# Patient Record
Sex: Female | Born: 1960 | Hispanic: No | Marital: Married | Smoking: Former smoker
Health system: Southern US, Community
[De-identification: ages and names within clinical notes are randomized; demographics above are authoritative.]

## PROBLEM LIST (undated history)

## (undated) DIAGNOSIS — K219 Gastro-esophageal reflux disease without esophagitis: Secondary | ICD-10-CM

## (undated) DIAGNOSIS — R2230 Localized swelling, mass and lump, unspecified upper limb: Secondary | ICD-10-CM

## (undated) DIAGNOSIS — E049 Nontoxic goiter, unspecified: Secondary | ICD-10-CM

## (undated) DIAGNOSIS — R51 Headache: Secondary | ICD-10-CM

## (undated) DIAGNOSIS — D239 Other benign neoplasm of skin, unspecified: Secondary | ICD-10-CM

## (undated) DIAGNOSIS — G4730 Sleep apnea, unspecified: Secondary | ICD-10-CM

## (undated) DIAGNOSIS — T7840XA Allergy, unspecified, initial encounter: Secondary | ICD-10-CM

## (undated) HISTORY — PX: DILATION AND CURETTAGE OF UTERUS: SHX78

## (undated) HISTORY — DX: Gastro-esophageal reflux disease without esophagitis: K21.9

## (undated) HISTORY — DX: Other benign neoplasm of skin, unspecified: D23.9

## (undated) HISTORY — PX: ABDOMINAL HYSTERECTOMY: SHX81

## (undated) HISTORY — PX: CHOLECYSTECTOMY: SHX55

## (undated) HISTORY — DX: Allergy, unspecified, initial encounter: T78.40XA

## (undated) HISTORY — DX: Nontoxic goiter, unspecified: E04.9

## (undated) HISTORY — DX: Localized swelling, mass and lump, unspecified upper limb: R22.30

---

## 2001-03-17 ENCOUNTER — Encounter: Admission: RE | Admit: 2001-03-17 | Discharge: 2001-03-17 | Payer: Self-pay | Admitting: Family Medicine

## 2002-08-10 ENCOUNTER — Inpatient Hospital Stay (HOSPITAL_COMMUNITY): Admission: EM | Admit: 2002-08-10 | Discharge: 2002-08-12 | Payer: Self-pay | Admitting: Emergency Medicine

## 2012-01-15 ENCOUNTER — Encounter (INDEPENDENT_AMBULATORY_CARE_PROVIDER_SITE_OTHER): Payer: Self-pay

## 2012-01-27 ENCOUNTER — Other Ambulatory Visit: Payer: Self-pay | Admitting: Internal Medicine

## 2012-01-27 DIAGNOSIS — R599 Enlarged lymph nodes, unspecified: Secondary | ICD-10-CM

## 2012-02-03 ENCOUNTER — Ambulatory Visit (HOSPITAL_COMMUNITY)
Admission: RE | Admit: 2012-02-03 | Discharge: 2012-02-03 | Disposition: A | Discharge status: Home / Self Care | Payer: 59 | Source: Ambulatory Visit | Attending: Internal Medicine | Admitting: Internal Medicine

## 2012-02-03 DIAGNOSIS — R599 Enlarged lymph nodes, unspecified: Secondary | ICD-10-CM

## 2012-02-03 DIAGNOSIS — E041 Nontoxic single thyroid nodule: Secondary | ICD-10-CM | POA: Insufficient documentation

## 2012-02-03 DIAGNOSIS — E049 Nontoxic goiter, unspecified: Secondary | ICD-10-CM | POA: Insufficient documentation

## 2012-02-11 ENCOUNTER — Ambulatory Visit (INDEPENDENT_AMBULATORY_CARE_PROVIDER_SITE_OTHER): Payer: 59 | Admitting: General Surgery

## 2012-02-11 ENCOUNTER — Encounter (INDEPENDENT_AMBULATORY_CARE_PROVIDER_SITE_OTHER): Payer: Self-pay | Admitting: General Surgery

## 2012-02-11 VITALS — BP 142/88 | HR 64 | Ht 64.0 in | Wt 195.0 lb

## 2012-02-11 DIAGNOSIS — R2230 Localized swelling, mass and lump, unspecified upper limb: Secondary | ICD-10-CM

## 2012-02-11 DIAGNOSIS — R229 Localized swelling, mass and lump, unspecified: Secondary | ICD-10-CM

## 2012-02-11 NOTE — H&P (Signed)
Carrie Mcclain is an 51 y.o. female.   Chief Complaint: large left axillary mass HPI:The patient reports having had this large left axillary mass for at least 2 years. Recently came to the attention of her primary care physician Dr. Jasmine December and cousins. Evaluation included bilateral mammograms along with an ultrasound of the mass along with a core biopsy. They could not rule out a degenerating malignancy however it looks more inflammatory than malignant.  She was referred for evaluation and excision.  Past Medical History  Diagnosis Date  . Axillary mass     Past Surgical History  Procedure Date  . Cholecystectomy   . Abdominal hysterectomy   . Dilation and curettage of uterus     Family History  Problem Relation Age of Onset  . Diabetes    . Heart disease Mother    Social History:  reports that she has quit smoking. She does not have any smokeless tobacco history on file. She reports that she drinks alcohol. She reports that she does not use illicit drugs.  Allergies:  Allergies  Allergen Reactions  . Penicillins Hives and Other (See Comments)    Difficulty swallowing   . Sulfa Antibiotics     No current outpatient prescriptions on file as of 02/11/2012.   No current facility-administered medications on file as of 02/11/2012.    No results found for this or any previous visit (from the past 48 hour(s)). No results found.  Review of Systems  Constitutional: Negative.   HENT: Negative.   Eyes: Negative.   Cardiovascular: Negative.   Gastrointestinal: Negative.   Genitourinary: Negative.   Musculoskeletal: Negative.   Skin: Negative.   Neurological: Negative.   Endo/Heme/Allergies: Negative.   Psychiatric/Behavioral: Negative.     Blood pressure 142/88, pulse 64, height 5\' 4"  (1.626 m), weight 88.451 kg (195 lb). Physical Exam  Constitutional: She is oriented to person, place, and time. She appears well-developed and well-nourished.  HENT:  Head: Normocephalic  and atraumatic.  Eyes: Conjunctivae and EOM are normal. Pupils are equal, round, and reactive to light.  Neck: Trachea normal and phonation normal. Normal carotid pulses present. Carotid bruit is not present. Mass (left thyroid goiter) and thyromegaly (left goiter) present.    Cardiovascular: Normal rate, regular rhythm and normal heart sounds.   Respiratory: Effort normal and breath sounds normal. Chest wall is not dull to percussion. She exhibits mass (see diagram of left axilla). She exhibits no tenderness, no bony tenderness, no laceration and no crepitus. Right breast exhibits no inverted nipple, no mass, no nipple discharge and no skin change. Left breast exhibits no inverted nipple, no mass, no nipple discharge and no skin change. Breasts are symmetrical.    GI: Soft. Bowel sounds are normal.  Musculoskeletal: Normal range of motion.  Neurological: She is alert and oriented to person, place, and time. She has normal reflexes.  Skin: Skin is warm and dry.  Psychiatric: She has a normal mood and affect. Her behavior is normal. Judgment and thought content normal.     Assessment/Plan Large left cystic axillary mass.  Left thyroid goiter. Currently under evaluation by her primary care physician Dr. Margaretmary Bayley  We are planning an excision under general anesthesia the left axillary mass.  Hooper Petteway III,Ruqayyah Lute O 02/11/2012, 9:03 AM

## 2012-02-11 NOTE — Progress Notes (Signed)
Encounter note in H&P  JW

## 2012-02-12 ENCOUNTER — Encounter (HOSPITAL_COMMUNITY): Payer: Self-pay | Admitting: Respiratory Therapy

## 2012-02-12 ENCOUNTER — Encounter (INDEPENDENT_AMBULATORY_CARE_PROVIDER_SITE_OTHER): Payer: Self-pay

## 2012-02-17 ENCOUNTER — Encounter (HOSPITAL_COMMUNITY): Payer: Self-pay

## 2012-02-17 ENCOUNTER — Encounter (HOSPITAL_COMMUNITY)
Admission: RE | Admit: 2012-02-17 | Discharge: 2012-02-17 | Disposition: A | Discharge status: Home / Self Care | Payer: 59 | Source: Ambulatory Visit | Attending: General Surgery | Admitting: General Surgery

## 2012-02-17 ENCOUNTER — Other Ambulatory Visit: Payer: Self-pay

## 2012-02-17 HISTORY — DX: Sleep apnea, unspecified: G47.30

## 2012-02-17 HISTORY — DX: Headache: R51

## 2012-02-17 LAB — CBC
HCT: 40.2 % (ref 36.0–46.0)
MCV: 77.9 fL — ABNORMAL LOW (ref 78.0–100.0)
Platelets: 337 10*3/uL (ref 150–400)
RBC: 5.16 MIL/uL — ABNORMAL HIGH (ref 3.87–5.11)
WBC: 6.5 10*3/uL (ref 4.0–10.5)

## 2012-02-17 LAB — BASIC METABOLIC PANEL
CO2: 28 mEq/L (ref 19–32)
Calcium: 9.6 mg/dL (ref 8.4–10.5)
Chloride: 105 mEq/L (ref 96–112)
Glucose, Bld: 104 mg/dL — ABNORMAL HIGH (ref 70–99)
Sodium: 141 mEq/L (ref 135–145)

## 2012-02-17 LAB — SURGICAL PCR SCREEN: Staphylococcus aureus: NEGATIVE

## 2012-02-17 LAB — DIFFERENTIAL
Eosinophils Relative: 4 % (ref 0–5)
Lymphocytes Relative: 44 % (ref 12–46)
Lymphs Abs: 2.9 10*3/uL (ref 0.7–4.0)

## 2012-02-17 NOTE — Pre-Procedure Instructions (Addendum)
20 Carrie Mcclain  02/17/2012   Your procedure is scheduled on: 3.6.13  Report to [id number] AM.  Call this number if you have problems the morning of surgery: [phone]   Remember:   Do not eat food:After Midnight.  May have clear liquids: up to 4 Hours before arrival.430 am  Clear liquids include soda, tea, black coffee, apple or grape juice, broth.  Take these medicines the morning of surgery with A SIP OF WATER: none   Do not wear jewelry, make-up or nail polish.  Do not wear lotions, powders, or perfumes. You may wear deodorant.  Do not shave 48 hours prior to surgery.  Do not bring valuables to the hospital.  Contacts, dentures or bridgework may not be worn into surgery.  Leave suitcase in the car. After surgery it may be brought to your room.  For patients admitted to the hospital, checkout time is 11:00 AM the day of discharge.   Patients discharged the day of surgery will not be allowed to drive home.  Name and phone number of your driver: Carrie Mcclain [phone]  Special Instructions: CHG Shower Use Special Wash: 1/2 bottle night before surgery and 1/2 bottle morning of surgery.   Please read over the following fact sheets that you were given: Pain Booklet, Coughing and Deep Breathing, MRSA Information and Surgical Site Infection Prevention

## 2012-02-17 NOTE — Progress Notes (Signed)
Anesthesia to review cxr  ----tracheal shift---- notes in epic re  Thyroid  Scan enlarged

## 2012-02-18 ENCOUNTER — Encounter (HOSPITAL_COMMUNITY): Payer: Self-pay | Admitting: Vascular Surgery

## 2012-02-18 MED ORDER — CIPROFLOXACIN IN D5W 400 MG/200ML IV SOLN
400.0000 mg | Freq: Two times a day (BID) | INTRAVENOUS | Status: DC
  Administered 2012-02-19: 400 mg via INTRAVENOUS
  Filled 2012-02-18 (×2): qty 200

## 2012-02-18 NOTE — Consult Note (Signed)
Anesthesia:  Patient is a 51 year old female scheduled for excision of a left axillary mass (core biopsy was non-diagnostic but favored inflammatory over malignant process) on 02/19/12 with planned GA.  History includes OSA, headaches, former smoker, left thyroid goiter being followed by Dr. Margaretmary Bayley.  Her BP since February have been moderately elevated, although I do not see that she has a formal diagnosis of HTN.  Labs noted.  EKG shows NSR.    CXR from 02/17/12 showed mild enlargement of the cardiac silhouette. No pulmonary edema, pneumonia other acute cardiopulmonary abnormality is evident. There is deviation of the trachea to the right.   Ultrasound of the neck on 02/03/12 showed: 1. Left-sided thyroid goiter.  2. Small right-sided thyroid cysts. 3. Right thyroid lobe: Measures 4.1 x 1.4 x 1.5 cm  4. Left thyroid lobe: Measures 8.1 x 4.9 x 6.2 cm  5. Isthmus: Measures 0.7 cm   She did not report a history of difficult intubation, but with a thyroid goiter there is the potential risk.  She will be evaluated by her assigned Anesthesiologist on the day of surgery and a definitive anesthesia plan with airway management will be discussed with her at that time.  She has two different weights in Epic within the last month that are conflicting by over 90 lbs, so I will order that she be re-weighed when she arrives to Short Stay on the day of surgery to ensure accuracy.

## 2012-02-18 NOTE — Anesthesia Preprocedure Evaluation (Addendum)
Anesthesia Evaluation  Patient identified by MRN, date of birth, ID band Patient awake  General Assessment Comment:Potential for difficult airway d/t left goiter with right tracheal deviation.  Reviewed: Allergy & Precautions, H&P , NPO status , Patient's Chart, lab work & pertinent test results, reviewed documented beta blocker date and time   History of Anesthesia Complications (+) DIFFICULT AIRWAY  Airway Mallampati: II TM Distance: >3 FB Neck ROM: Full    Dental  (+) Dental Advisory Given and Teeth Intact   Pulmonary sleep apnea ,    Pulmonary exam normal       Cardiovascular negative cardio ROS      Neuro/Psych  Headaches,    GI/Hepatic negative GI ROS, Neg liver ROS,   Endo/Other  Morbid obesityLeft sided goiter  Renal/GU negative Renal ROS     Musculoskeletal   Abdominal   Peds  Hematology   Anesthesia Other Findings   Reproductive/Obstetrics                          Anesthesia Physical Anesthesia Plan  ASA: III  Anesthesia Plan: General   Post-op Pain Management:    Induction:   Airway Management Planned: Oral ETT and LMA  Additional Equipment:   Intra-op Plan:   Post-operative Plan: Extubation in OR  Informed Consent: I have reviewed the patients History and Physical, chart, labs and discussed the procedure including the risks, benefits and alternatives for the proposed anesthesia with the patient or authorized representative who has indicated his/her understanding and acceptance.   Dental advisory given  Plan Discussed with: CRNA, Anesthesiologist and Surgeon  Anesthesia Plan Comments:         Anesthesia Quick Evaluation

## 2012-02-19 ENCOUNTER — Ambulatory Visit (HOSPITAL_COMMUNITY)
Admission: RE | Admit: 2012-02-19 | Discharge: 2012-02-19 | Disposition: A | Discharge status: Home / Self Care | Payer: 59 | Source: Ambulatory Visit | Attending: General Surgery | Admitting: General Surgery

## 2012-02-19 ENCOUNTER — Ambulatory Visit (HOSPITAL_BASED_OUTPATIENT_CLINIC_OR_DEPARTMENT_OTHER): Admit: 2012-02-19 | Payer: Self-pay | Admitting: General Surgery

## 2012-02-19 ENCOUNTER — Encounter (HOSPITAL_COMMUNITY)
Admission: RE | Disposition: A | Discharge status: Home / Self Care | Payer: Self-pay | Source: Ambulatory Visit | Attending: General Surgery

## 2012-02-19 ENCOUNTER — Encounter (HOSPITAL_COMMUNITY): Payer: Self-pay | Admitting: *Deleted

## 2012-02-19 ENCOUNTER — Encounter (HOSPITAL_COMMUNITY): Payer: Self-pay | Admitting: Vascular Surgery

## 2012-02-19 ENCOUNTER — Encounter (HOSPITAL_BASED_OUTPATIENT_CLINIC_OR_DEPARTMENT_OTHER): Payer: Self-pay

## 2012-02-19 ENCOUNTER — Ambulatory Visit (HOSPITAL_COMMUNITY): Payer: 59 | Admitting: Vascular Surgery

## 2012-02-19 DIAGNOSIS — R2230 Localized swelling, mass and lump, unspecified upper limb: Secondary | ICD-10-CM

## 2012-02-19 DIAGNOSIS — Z01812 Encounter for preprocedural laboratory examination: Secondary | ICD-10-CM | POA: Insufficient documentation

## 2012-02-19 DIAGNOSIS — D235 Other benign neoplasm of skin of trunk: Secondary | ICD-10-CM | POA: Insufficient documentation

## 2012-02-19 DIAGNOSIS — E049 Nontoxic goiter, unspecified: Secondary | ICD-10-CM | POA: Insufficient documentation

## 2012-02-19 HISTORY — PX: MASS EXCISION: SHX2000

## 2012-02-19 SURGERY — EXCISION, HIDRADENITIS, AXILLA
Anesthesia: General | Laterality: Left

## 2012-02-19 SURGERY — EXCISION MASS
Anesthesia: General | Site: Axilla | Laterality: Left | Wound class: Clean

## 2012-02-19 SURGERY — EXCISION, HIDRADENITIS, AXILLA
Anesthesia: General | Site: Axilla | Laterality: Left

## 2012-02-19 MED ORDER — DROPERIDOL 2.5 MG/ML IJ SOLN: 0.6250 mg | INTRAMUSCULAR | Status: DC | PRN

## 2012-02-19 MED ORDER — ONDANSETRON HCL 4 MG/2ML IJ SOLN
INTRAMUSCULAR | Status: DC | PRN
  Administered 2012-02-19: 4 mg via INTRAVENOUS

## 2012-02-19 MED ORDER — BUPIVACAINE-EPINEPHRINE 0.5% -1:200000 IJ SOLN: INTRAMUSCULAR | Status: DC | PRN

## 2012-02-19 MED ORDER — LACTATED RINGERS IV SOLN
INTRAVENOUS | Status: DC | PRN
  Administered 2012-02-19 (×2): via INTRAVENOUS

## 2012-02-19 MED ORDER — LACTATED RINGERS IV SOLN
INTRAVENOUS | Status: DC
  Administered 2012-02-19: 10:00:00 via INTRAVENOUS

## 2012-02-19 MED ORDER — SUCCINYLCHOLINE CHLORIDE 20 MG/ML IJ SOLN
INTRAMUSCULAR | Status: DC | PRN
  Administered 2012-02-19: 120 mg via INTRAVENOUS

## 2012-02-19 MED ORDER — FENTANYL CITRATE 0.05 MG/ML IJ SOLN
INTRAMUSCULAR | Status: DC | PRN
  Administered 2012-02-19: 50 ug via INTRAVENOUS
  Administered 2012-02-19: 100 ug via INTRAVENOUS
  Administered 2012-02-19: 50 ug via INTRAVENOUS

## 2012-02-19 MED ORDER — PROPOFOL 10 MG/ML IV EMUL
INTRAVENOUS | Status: DC | PRN
  Administered 2012-02-19: 200 mg via INTRAVENOUS

## 2012-02-19 MED ORDER — 0.9 % SODIUM CHLORIDE (POUR BTL) OPTIME
TOPICAL | Status: DC | PRN
  Administered 2012-02-19: 1000 mL

## 2012-02-19 MED ORDER — HYDROMORPHONE HCL PF 1 MG/ML IJ SOLN
0.2500 mg | INTRAMUSCULAR | Status: DC | PRN
  Administered 2012-02-19 (×2): 0.25 mg via INTRAVENOUS

## 2012-02-19 MED ORDER — HYDROCODONE-ACETAMINOPHEN 5-500 MG PO TABS: 1.0000 | ORAL_TABLET | Freq: Four times a day (QID) | ORAL | Status: AC | PRN

## 2012-02-19 MED ORDER — MIDAZOLAM HCL 2 MG/2ML IJ SOLN: 1.0000 mg | INTRAMUSCULAR | Status: DC | PRN

## 2012-02-19 MED ORDER — MIDAZOLAM HCL 5 MG/5ML IJ SOLN
INTRAMUSCULAR | Status: DC | PRN
  Administered 2012-02-19: 2 mg via INTRAVENOUS

## 2012-02-19 MED ORDER — EPHEDRINE SULFATE 50 MG/ML IJ SOLN
INTRAMUSCULAR | Status: DC | PRN
  Administered 2012-02-19 (×2): 5 mg via INTRAVENOUS
  Administered 2012-02-19: 10 mg via INTRAVENOUS

## 2012-02-19 SURGICAL SUPPLY — 52 items
APPLIER CLIP 11 MED OPEN (CLIP) ×2
BLADE SURG 10 STRL SS (BLADE) ×2 IMPLANT
BLADE SURG 15 STRL LF DISP TIS (BLADE) ×1 IMPLANT
BLADE SURG 15 STRL SS (BLADE) ×1
CANISTER SUCTION 2500CC (MISCELLANEOUS) ×2 IMPLANT
CHLORAPREP W/TINT 26ML (MISCELLANEOUS) ×2 IMPLANT
CLEANER TIP ELECTROSURG 2X2 (MISCELLANEOUS) ×2 IMPLANT
CLIP APPLIE 11 MED OPEN (CLIP) ×1 IMPLANT
CLOTH BEACON ORANGE TIMEOUT ST (SAFETY) ×2 IMPLANT
COVER SURGICAL LIGHT HANDLE (MISCELLANEOUS) ×2 IMPLANT
DECANTER SPIKE VIAL GLASS SM (MISCELLANEOUS) ×2 IMPLANT
DERMABOND ADVANCED (GAUZE/BANDAGES/DRESSINGS) ×1
DERMABOND ADVANCED .7 DNX12 (GAUZE/BANDAGES/DRESSINGS) ×1 IMPLANT
DRAPE LAPAROTOMY T 102X78X121 (DRAPES) ×2 IMPLANT
DRAPE PROXIMA HALF (DRAPES) ×2 IMPLANT
DRAPE UTILITY 15X26 W/TAPE STR (DRAPE) ×4 IMPLANT
ELECT REM PT RETURN 9FT ADLT (ELECTROSURGICAL) ×2
ELECTRODE REM PT RTRN 9FT ADLT (ELECTROSURGICAL) ×1 IMPLANT
GAUZE SPONGE 4X4 16PLY XRAY LF (GAUZE/BANDAGES/DRESSINGS) ×2 IMPLANT
GLOVE BIO SURGEON STRL SZ7.5 (GLOVE) ×2 IMPLANT
GLOVE BIOGEL PI IND STRL 7.0 (GLOVE) ×2 IMPLANT
GLOVE BIOGEL PI IND STRL 7.5 (GLOVE) ×1 IMPLANT
GLOVE BIOGEL PI IND STRL 8 (GLOVE) ×1 IMPLANT
GLOVE BIOGEL PI INDICATOR 7.0 (GLOVE) ×2
GLOVE BIOGEL PI INDICATOR 7.5 (GLOVE) ×1
GLOVE BIOGEL PI INDICATOR 8 (GLOVE) ×1
GLOVE ECLIPSE 7.5 STRL STRAW (GLOVE) ×2 IMPLANT
GLOVE SURG SS PI 6.5 STRL IVOR (GLOVE) ×4 IMPLANT
GOWN STRL NON-REIN LRG LVL3 (GOWN DISPOSABLE) ×8 IMPLANT
KIT BASIN OR (CUSTOM PROCEDURE TRAY) ×2 IMPLANT
KIT ROOM TURNOVER OR (KITS) ×2 IMPLANT
NEEDLE HYPO 25X1 1.5 SAFETY (NEEDLE) ×2 IMPLANT
NS IRRIG 1000ML POUR BTL (IV SOLUTION) ×2 IMPLANT
PACK SURGICAL SETUP 50X90 (CUSTOM PROCEDURE TRAY) ×2 IMPLANT
PAD ARMBOARD 7.5X6 YLW CONV (MISCELLANEOUS) ×2 IMPLANT
PENCIL BUTTON HOLSTER BLD 10FT (ELECTRODE) ×2 IMPLANT
SPECIMEN JAR MEDIUM (MISCELLANEOUS) ×2 IMPLANT
SPECIMEN JAR SMALL (MISCELLANEOUS) IMPLANT
SPONGE GAUZE 4X4 12PLY (GAUZE/BANDAGES/DRESSINGS) ×2 IMPLANT
SPONGE LAP 18X18 X RAY DECT (DISPOSABLE) ×2 IMPLANT
STAPLER VISISTAT 35W (STAPLE) ×2 IMPLANT
STRIP CLOSURE SKIN 1/2X4 (GAUZE/BANDAGES/DRESSINGS) IMPLANT
SUT MNCRL AB 4-0 PS2 18 (SUTURE) ×2 IMPLANT
SUT VIC AB 3-0 SH 18 (SUTURE) ×2 IMPLANT
SUT VIC AB 3-0 SH 27 (SUTURE) ×1
SUT VIC AB 3-0 SH 27X BRD (SUTURE) ×1 IMPLANT
SYR BULB 3OZ (MISCELLANEOUS) ×2 IMPLANT
SYR CONTROL 10ML LL (SYRINGE) ×2 IMPLANT
TOWEL OR 17X24 6PK STRL BLUE (TOWEL DISPOSABLE) ×2 IMPLANT
TOWEL OR 17X26 10 PK STRL BLUE (TOWEL DISPOSABLE) ×2 IMPLANT
TUBE CONNECTING 12X1/4 (SUCTIONS) ×2 IMPLANT
YANKAUER SUCT BULB TIP NO VENT (SUCTIONS) ×2 IMPLANT

## 2012-02-19 NOTE — Preoperative (Signed)
Beta Blockers   Reason not to administer Beta Blockers:Not Applicable 

## 2012-02-19 NOTE — Interval H&P Note (Signed)
History and Physical Interval Note:  02/19/2012 8:33 AM  Carrie Mcclain  has presented today for surgery, with the diagnosis of large cystic mass left axilla  The various methods of treatment have been discussed with the patient and family. After consideration of risks, benefits and other options for treatment, the patient has consented to  Procedure(s) (LRB): EXCISION AXILLARY MASS (Left) as a surgical intervention .  The patients' history has been reviewed, patient examined, no change in status, stable for surgery.  I have reviewed the patients' chart and labs.  Questions were answered to the patient's satisfaction.     Kdyn Vonbehren III,Henretta Quist O

## 2012-02-19 NOTE — Op Note (Signed)
OPERATIVE REPORT  DATE OF OPERATION: 02/19/2012  PATIENT:  Benjie Karvonen  51 y.o. female  PRE-OPERATIVE DIAGNOSIS:  Large left axillary cystic mass.  POST-OPERATIVE DIAGNOSIS:  Large left axillary cystic mass, hemorrhagic  PROCEDURE:  Procedure(s): EXCISION LEFT AXILLARY MASS  SURGEON:  Surgeon(s): Cherylynn Ridges III, MD  ASSISTANT: None  ANESTHESIA:   general  EBL: 30 ml  BLOOD ADMINISTERED: none  DRAINS: none   SPECIMEN:  Source of Specimen:  Left axillary cyst.  COUNTS CORRECT:  YES  PROCEDURE DETAILS: The patient was taken to the operating room and placed on the table in the supine position. After an adequate endotracheal anesthetic was administered her left arm was placed out at right angles to her body that she was prepped and draped in the usual sterile manner exposing her left axilla.  After proper time out was performed identifying the patient and the procedure to be performed we made a 5-6 cm transverse axillary anterior to posterior incision using a #15 blade taken name oval piece of skin along with this very large 4-5 cm mass. We subsequently dissected out this hemorrhagic cystic mass using electrocautery Metzenbaum scissors and a #15 blade. Care was taken not to rupture this cyst prior to its removal however in the end stages of removal he did rupture and clean out some clear hemorrhagic fluid. The no pus.  Weak of excessive redundant skin then subsequently closed after obtaining adequate hemostasis using electrocautery. 3-0 Vicryl pop also used to reapproximate the subcutaneous tissue then the skin was closed using stainless steel staples. All counts were correct. Betadine ointment and 4 x 4 gauze used to complete the dressings  PATIENT DISPOSITION:  PACU - hemodynamically stable.   Grey Schlauch III,Miakoda Mcmillion O 3/6/201311:57 AM

## 2012-02-19 NOTE — Transfer of Care (Signed)
Immediate Anesthesia Transfer of Care Note  Patient: Carrie Mcclain  Procedure(s) Performed: Procedure(s) (LRB): EXCISION MASS (Left)  Patient Location: PACU  Anesthesia Type: General  Level of Consciousness: awake and patient cooperative  Airway & Oxygen Therapy: Patient Spontanous Breathing and Patient connected to face mask oxygen Stable  Post vital signs: Reviewed and stable  Complications: No apparent anesthesia complications

## 2012-02-19 NOTE — H&P (View-Only) (Signed)
Carrie Mcclain is an 50 y.Mcclain. female.   Chief Complaint: large left axillary mass HPI:The patient reports having had this large left axillary mass for at least 2 years. Recently came to the attention of her primary care physician Carrie Mcclain and cousins. Evaluation included bilateral mammograms along with an ultrasound of the mass along with a core biopsy. They could not rule out a degenerating malignancy however it looks more inflammatory than malignant.  She was referred for evaluation and excision.  Past Medical History  Diagnosis Date  . Axillary mass     Past Surgical History  Procedure Date  . Cholecystectomy   . Abdominal hysterectomy   . Dilation and curettage of uterus     Family History  Problem Relation Age of Onset  . Diabetes    . Heart disease Mother    Social History:  reports that she has quit smoking. She does not have any smokeless tobacco history on file. She reports that she drinks alcohol. She reports that she does not use illicit drugs.  Allergies:  Allergies  Allergen Reactions  . Penicillins Hives and Other (See Comments)    Difficulty swallowing   . Sulfa Antibiotics     No current outpatient prescriptions on file as of 02/11/2012.   No current facility-administered medications on file as of 02/11/2012.    No results found for this or any previous visit (from the past 48 hour(s)). No results found.  Review of Systems  Constitutional: Negative.   HENT: Negative.   Eyes: Negative.   Cardiovascular: Negative.   Gastrointestinal: Negative.   Genitourinary: Negative.   Musculoskeletal: Negative.   Skin: Negative.   Neurological: Negative.   Endo/Heme/Allergies: Negative.   Psychiatric/Behavioral: Negative.     Blood pressure 142/88, pulse 64, height 5' 4" (1.626 m), weight 88.451 kg (195 lb). Physical Exam  Constitutional: She is oriented to person, place, and time. She appears well-developed and well-nourished.  HENT:  Head: Normocephalic  and atraumatic.  Eyes: Conjunctivae and EOM are normal. Pupils are equal, round, and reactive to light.  Neck: Trachea normal and phonation normal. Normal carotid pulses present. Carotid bruit is not present. Mass (left thyroid goiter) and thyromegaly (left goiter) present.    Cardiovascular: Normal rate, regular rhythm and normal heart sounds.   Respiratory: Effort normal and breath sounds normal. Chest wall is not dull to percussion. She exhibits mass (see diagram of left axilla). She exhibits no tenderness, no bony tenderness, no laceration and no crepitus. Right breast exhibits no inverted nipple, no mass, no nipple discharge and no skin change. Left breast exhibits no inverted nipple, no mass, no nipple discharge and no skin change. Breasts are symmetrical.    GI: Soft. Bowel sounds are normal.  Musculoskeletal: Normal range of motion.  Neurological: She is alert and oriented to person, place, and time. She has normal reflexes.  Skin: Skin is warm and dry.  Psychiatric: She has a normal mood and affect. Her behavior is normal. Judgment and thought content normal.     Assessment/Plan Large left cystic axillary mass.  Left thyroid goiter. Currently under evaluation by her primary care physician Carrie Mcclain  We are planning an excision under general anesthesia the left axillary mass.  Carrie Mcclain,Carrie Mcclain 02/11/2012, 9:03 AM    

## 2012-02-19 NOTE — Anesthesia Postprocedure Evaluation (Signed)
Anesthesia Post Note  Patient: Carrie Mcclain  Procedure(s) Performed: Procedure(s) (LRB): EXCISION MASS (Left)  Anesthesia type: general  Patient location: PACU  Post pain: Pain level controlled  Post assessment: Patient's Cardiovascular Status Stable  Last Vitals:  Filed Vitals:   02/19/12 1230  BP:   Pulse: 102  Temp:   Resp: 20    Post vital signs: Reviewed and stable  Level of consciousness: sedated  Complications: No apparent anesthesia complications

## 2012-02-19 NOTE — Anesthesia Procedure Notes (Signed)
Procedure Name: Intubation Date/Time: 02/19/2012 10:37 AM Performed by: Malachi Pro Pre-anesthesia Checklist: Patient identified, Emergency Drugs available, Suction available, Patient being monitored and Timeout performed Patient Re-evaluated:Patient Re-evaluated prior to inductionOxygen Delivery Method: Circle system utilized Preoxygenation: Pre-oxygenation with 100% oxygen Intubation Type: Rapid sequence and IV induction Ventilation: Mask ventilation without difficulty Laryngoscope Size: Mac and 3 Grade View: Grade II Tube type: Oral Number of attempts: 1 Airway Equipment and Method: Stylet Placement Confirmation: ETT inserted through vocal cords under direct vision,  positive ETCO2 and breath sounds checked- equal and bilateral Secured at: 23 cm Tube secured with: Tape Dental Injury: Teeth and Oropharynx as per pre-operative assessment

## 2012-02-21 ENCOUNTER — Encounter (INDEPENDENT_AMBULATORY_CARE_PROVIDER_SITE_OTHER): Payer: Self-pay

## 2012-02-21 ENCOUNTER — Encounter (HOSPITAL_COMMUNITY): Payer: Self-pay | Admitting: General Surgery

## 2012-02-24 ENCOUNTER — Encounter (INDEPENDENT_AMBULATORY_CARE_PROVIDER_SITE_OTHER): Payer: Self-pay

## 2012-03-04 ENCOUNTER — Ambulatory Visit (INDEPENDENT_AMBULATORY_CARE_PROVIDER_SITE_OTHER): Payer: 59 | Admitting: General Surgery

## 2012-03-04 DIAGNOSIS — R229 Localized swelling, mass and lump, unspecified: Secondary | ICD-10-CM

## 2012-03-04 DIAGNOSIS — R2230 Localized swelling, mass and lump, unspecified upper limb: Secondary | ICD-10-CM

## 2012-03-04 NOTE — Progress Notes (Signed)
Patient returning to the office for staple removal from left axillary excision (hidradenititis) staples removed, incision intact and patient tolerated well, I applied steri strips and placed dry gauze over the area to absorb moisture, patient will follow up with Dr. Lindie Spruce in one week (03/12/12)

## 2012-03-12 ENCOUNTER — Ambulatory Visit (INDEPENDENT_AMBULATORY_CARE_PROVIDER_SITE_OTHER): Payer: 59 | Admitting: General Surgery

## 2012-03-12 ENCOUNTER — Encounter (INDEPENDENT_AMBULATORY_CARE_PROVIDER_SITE_OTHER): Payer: Self-pay | Admitting: General Surgery

## 2012-03-12 VITALS — BP 128/84 | HR 68 | Ht 64.0 in | Wt 299.0 lb

## 2012-03-12 DIAGNOSIS — D239 Other benign neoplasm of skin, unspecified: Secondary | ICD-10-CM

## 2012-03-12 DIAGNOSIS — Z09 Encounter for follow-up examination after completed treatment for conditions other than malignant neoplasm: Secondary | ICD-10-CM

## 2012-03-12 HISTORY — DX: Other benign neoplasm of skin, unspecified: D23.9

## 2012-03-12 NOTE — Progress Notes (Signed)
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 She was seen in this office previously for staple removal which went well.  PE On examination today her wound is completely healed with no evidence of infection. There is some thickening of the scar in the central portion but not to the point of calling it keloid formation.  Studiy review None.  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  Plan Reassess the patient on a p.r.n. basis.

## 2012-09-15 ENCOUNTER — Ambulatory Visit (HOSPITAL_COMMUNITY)
Admission: RE | Admit: 2012-09-15 | Discharge: 2012-09-15 | Disposition: A | Discharge status: Home / Self Care | Payer: 59 | Source: Ambulatory Visit | Attending: Ophthalmology | Admitting: Ophthalmology

## 2012-09-15 DIAGNOSIS — H5310 Unspecified subjective visual disturbances: Secondary | ICD-10-CM

## 2012-09-15 DIAGNOSIS — H3400 Transient retinal artery occlusion, unspecified eye: Secondary | ICD-10-CM | POA: Insufficient documentation

## 2012-09-15 DIAGNOSIS — G453 Amaurosis fugax: Secondary | ICD-10-CM

## 2012-09-15 NOTE — Progress Notes (Signed)
VASCULAR LAB PRELIMINARY  PRELIMINARY  PRELIMINARY  PRELIMINARY  Carotid duplex has been  completed.    Preliminary report:  No significant ICA stenosis or plaque noted bilaterally.  Carrie Mcclain, 09/15/2012, 10:33 AM

## 2013-07-05 ENCOUNTER — Ambulatory Visit (INDEPENDENT_AMBULATORY_CARE_PROVIDER_SITE_OTHER): Payer: BC Managed Care – PPO | Admitting: Physician Assistant

## 2013-07-05 VITALS — BP 130/82 | HR 84 | Temp 98.1°F | Resp 18 | Ht 64.0 in | Wt 286.0 lb

## 2013-07-05 DIAGNOSIS — J309 Allergic rhinitis, unspecified: Secondary | ICD-10-CM

## 2013-07-05 DIAGNOSIS — R062 Wheezing: Secondary | ICD-10-CM

## 2013-07-05 MED ORDER — ALBUTEROL SULFATE HFA 108 (90 BASE) MCG/ACT IN AERS: 2.0000 | INHALATION_SPRAY | RESPIRATORY_TRACT | Status: DC | PRN

## 2013-07-05 MED ORDER — FLUTICASONE PROPIONATE 50 MCG/ACT NA SUSP: 2.0000 | Freq: Every day | NASAL | Status: DC

## 2013-07-05 NOTE — Progress Notes (Signed)
  Subjective:    Patient ID: Carrie Mcclain, female    DOB: July 06, 1961, 52 y.o.   MRN: [id number]  HPI    Carrie Mcclain is a very pleasant 52 yr old female here with concern for allergic reaction.  States she has been having episodes where she feels itchy, has an itchy throat, coughing and wheezing.  These episodes have happened in the past but seem to be happening more frequently - twice today, several nights this week.  Wheezing seems to be worse at night.  These episodes are lasting about an hour.  Zyrtec has helped symptoms - this is the only thing she has tried so far.  No history of asthma - but reports bronchitis from time to time, though this feels different.  She denies allergic rhinitis.  She denies any new contacts, specifically denies new foods, pets, detergents, soaps, lotions.  Denies any plant exposure but states she was working outside the other day.  Describes runny nose and sneezing, itchy ears, itchy throat.  Cough is productive of clear mucus.  No fever or chills.  No CP.  No NV.  Has used some sort of inhaler in the past - thinks maybe "the purple one".  Thinks may have used a nasal spray a long time ago.     Review of Systems  Constitutional: Negative for fever and chills.  HENT: Positive for rhinorrhea and sneezing. Negative for ear pain.   Respiratory: Positive for cough and wheezing.   Cardiovascular: Negative for chest pain.  Gastrointestinal: Negative.   Musculoskeletal: Negative.   Skin: Negative.   Neurological: Negative.        Objective:   Physical Exam  Vitals reviewed. Constitutional: She is oriented to person, place, and time. She appears well-developed and well-nourished.  HENT:  Head: Normocephalic and atraumatic.  Right Ear: Tympanic membrane and ear canal normal.  Nose: Mucosal edema present.  Mouth/Throat: Uvula is midline, oropharynx is clear and moist and mucous membranes are normal.  Left EAC occluded with cerumen  Eyes: Conjunctivae are normal. No  scleral icterus.  Neck: Neck supple.  Cardiovascular: Normal rate, regular rhythm and normal heart sounds.   Pulmonary/Chest: Effort normal. She has no decreased breath sounds. She has wheezes (with forced expiration). She has no rhonchi. She has no rales.  Abdominal: Soft. There is no tenderness.  Lymphadenopathy:    She has no cervical adenopathy.  Neurological: She is alert and oriented to person, place, and time.  Skin: Skin is warm and dry.  Psychiatric: She has a normal mood and affect. Her behavior is normal.        Assessment & Plan:  Allergic rhinitis - Plan: albuterol (PROVENTIL HFA;VENTOLIN HFA) 108 (90 BASE) MCG/ACT inhaler, fluticasone (FLONASE) 50 MCG/ACT nasal spray  Wheezing - Plan: albuterol (PROVENTIL HFA;VENTOLIN HFA) 108 (90 BASE) MCG/ACT inhaler   Carrie Mcclain is a very pleasant 52 yr old female with episodes of wheezing, coughing, rhinorrhea - I think her symptoms are likely allergic, though it is difficult to say what the trigger is.  On exam, she is afebrile and in no distress, pulse ox 98% on RA, lungs with fine wheezes on forced expiration only.  Will continue daily Zyrtec.  Add Flonase.  Add albuterol q4h prn wheezing.  Pt to call or RTC if worsening or not improving.

## 2013-07-05 NOTE — Patient Instructions (Addendum)
Continue taking the Zyrtec once daily.  Begin using the Flonase (fluticasone) daily as well.  Both of these medicine work best with consistent daily use.  Also begin using the albuterol inhaler every 4 hours if needed for wheezing/coughing.  Please let me know if any symptoms are worsening or not improving, or if new symptoms develop.   Allergic Rhinitis Allergic rhinitis is when the mucous membranes in the nose respond to allergens. Allergens are particles in the air that cause your body to have an allergic reaction. This causes you to release allergic antibodies. Through a chain of events, these eventually cause you to release histamine into the blood stream (hence the use of antihistamines). Although meant to be protective to the body, it is this release that causes your discomfort, such as frequent sneezing, congestion and an itchy runny nose.  CAUSES  The pollen allergens may come from grasses, trees, and weeds. This is seasonal allergic rhinitis, or "hay fever." Other allergens cause year-round allergic rhinitis (perennial allergic rhinitis) such as house dust mite allergen, pet dander and mold spores.  SYMPTOMS   Nasal stuffiness (congestion).  Runny, itchy nose with sneezing and tearing of the eyes.  There is often an itching of the mouth, eyes and ears. It cannot be cured, but it can be controlled with medications. DIAGNOSIS  If you are unable to determine the offending allergen, skin or blood testing may find it. TREATMENT   Avoid the allergen.  Medications and allergy shots (immunotherapy) can help.  Hay fever may often be treated with antihistamines in pill or nasal spray forms. Antihistamines block the effects of histamine. There are over-the-counter medicines that may help with nasal congestion and swelling around the eyes. Check with your caregiver before taking or giving this medicine. If the treatment above does not work, there are many new medications your caregiver can  prescribe. Stronger medications may be used if initial measures are ineffective. Desensitizing injections can be used if medications and avoidance fails. Desensitization is when a patient is given ongoing shots until the body becomes less sensitive to the allergen. Make sure you follow up with your caregiver if problems continue. SEEK MEDICAL CARE IF:   You develop fever (more than 100.5 F (38.1 C).  You develop a cough that does not stop easily (persistent).  You have shortness of breath.  You start wheezing.  Symptoms interfere with normal daily activities. Document Released: 08/27/2001 Document Revised: 02/24/2012 Document Reviewed: 03/08/2009 [facility] Patient Information 2014 [place], [place].

## 2013-07-15 ENCOUNTER — Ambulatory Visit (INDEPENDENT_AMBULATORY_CARE_PROVIDER_SITE_OTHER): Payer: BC Managed Care – PPO | Admitting: Internal Medicine

## 2013-07-15 VITALS — BP 148/82 | HR 78 | Temp 97.9°F | Resp 18 | Ht 64.5 in | Wt 285.0 lb

## 2013-07-15 DIAGNOSIS — R21 Rash and other nonspecific skin eruption: Secondary | ICD-10-CM

## 2013-07-15 DIAGNOSIS — E669 Obesity, unspecified: Secondary | ICD-10-CM

## 2013-07-15 DIAGNOSIS — J9801 Acute bronchospasm: Secondary | ICD-10-CM

## 2013-07-15 DIAGNOSIS — L299 Pruritus, unspecified: Secondary | ICD-10-CM

## 2013-07-15 LAB — GLUCOSE, POCT (MANUAL RESULT ENTRY): POC Glucose: 100 mg/dl — AB (ref 70–99)

## 2013-07-15 LAB — POCT CBC
HCT, POC: 41.7 % (ref 37.7–47.9)
Lymph, poc: 2.4 (ref 0.6–3.4)
MCHC: 31.7 g/dL — AB (ref 31.8–35.4)
MID (cbc): 0.5 (ref 0–0.9)
POC Granulocyte: 4 (ref 2–6.9)
POC LYMPH PERCENT: 34.7 %L (ref 10–50)
POC MID %: 7.6 %M (ref 0–12)
RDW, POC: 15.4 %

## 2013-07-15 MED ORDER — CLOBETASOL PROPIONATE 0.05 % EX CREA: TOPICAL_CREAM | Freq: Two times a day (BID) | CUTANEOUS | Status: DC

## 2013-07-15 MED ORDER — METHYLPREDNISOLONE ACETATE 80 MG/ML IJ SUSP
80.0000 mg | Freq: Once | INTRAMUSCULAR | Status: AC
  Administered 2013-07-15: 80 mg via INTRAMUSCULAR

## 2013-07-15 NOTE — Progress Notes (Signed)
  Subjective:    Patient ID: Carrie Mcclain, female    DOB: November 12, 1961, 52 y.o.   MRN: [id number]  HPI    Review of Systems     Objective:   Physical Exam        Assessment & Plan:

## 2013-07-15 NOTE — Progress Notes (Signed)
  Subjective:    Patient ID: Carrie Mcclain, female    DOB: 1961/06/22, 52 y.o.   MRN: [id number]  HPI Pt presents to clinic this morning with an itchy rash that began on her R hand and wrist last Wed 07/07/13. Pt believes she has reacted to Albuterol inhaler and/or Flonase that our office gave prescribed to her last Tuesday 07/06/13. She states she did not pick up the medicine until Wednesday morning. By Lucia Bitter. Night she began noticing the rash and itching. She now has the rash on bil hands, arms, legs, feet, and face. Pt states its very itchy and she has headache. No fever, nausea. No hx of dm but is obese. Only oral med she is on  Is zyrtec   Review of Systems     Objective:   Physical Exam  Vitals reviewed. Constitutional: She is oriented to person, place, and time. She appears well-nourished.  HENT:  Mouth/Throat: Oropharynx is clear and moist.  Eyes: EOM are normal. No scleral icterus.  Neck: Normal range of motion.  Cardiovascular: Normal rate, regular rhythm and normal heart sounds.   Pulmonary/Chest: Effort normal. She has wheezes.  Musculoskeletal: Normal range of motion.  Neurological: She is alert and oriented to person, place, and time. Coordination normal.  Skin: Skin is warm, dry and intact. Rash noted. Rash is papular and urticarial. There is erythema.  Scattered patches of red itchy bomps and plaques All acute appearing Not many areas but everywhere Excoriations They are not painful  Psychiatric: She has a normal mood and affect. Her behavior is normal. Judgment and thought content normal.     Results for orders placed in visit on 07/15/13  GLUCOSE, POCT (MANUAL RESULT ENTRY)      Result Value Range   POC Glucose 100 (*) 70 - 99 mg/dl   Results for orders placed in visit on 07/15/13  POCT CBC      Result Value Range   WBC 7.0  4.6 - 10.2 K/uL   Lymph, poc 2.4  0.6 - 3.4   POC LYMPH PERCENT 34.7  10 - 50 %L   MID (cbc) 0.5  0 - 0.9   POC MID % 7.6  0 - 12 %M    POC Granulocyte 4.0  2 - 6.9   Granulocyte percent 57.7  37 - 80 %G   RBC 5.25  4.04 - 5.48 M/uL   Hemoglobin 13.2  12.2 - 16.2 g/dL   HCT, POC 21.3  08.6 - 47.9 %   MCV 79.5 (*) 80 - 97 fL   MCH, POC 25.1 (*) 27 - 31.2 pg   MCHC 31.7 (*) 31.8 - 35.4 g/dL   RDW, POC 57.8     Platelet Count, POC 354  142 - 424 K/uL   MPV 9.6  0 - 99.8 fL  GLUCOSE, POCT (MANUAL RESULT ENTRY)      Result Value Range   POC Glucose 100 (*) 70 - 99 mg/dl  POCT GLYCOSYLATED HEMOGLOBIN (HGB A1C)      Result Value Range   Hemoglobin A1C 5.7            Assessment & Plan:  Allergic rash cause unclear Depomedrol 80mg /Clobetasol/Benedryl Refer to Dr. Lucie Leather

## 2013-07-15 NOTE — Patient Instructions (Signed)
Rash A rash is a change in the color or texture of your skin. There are many different types of rashes. You may have other problems that accompany your rash. CAUSES   Infections.  Allergic reactions. This can include allergies to pets or foods.  Certain medicines.  Exposure to certain chemicals, soaps, or cosmetics.  Heat.  Exposure to poisonous plants.  Tumors, both cancerous and noncancerous. SYMPTOMS   Redness.  Scaly skin.  Itchy skin.  Dry or cracked skin.  Bumps.  Blisters.  Pain. DIAGNOSIS  Your caregiver may do a physical exam to determine what type of rash you have. A skin sample (biopsy) may be taken and examined under a microscope. TREATMENT  Treatment depends on the type of rash you have. Your caregiver may prescribe certain medicines. For serious conditions, you may need to see a skin doctor (dermatologist). HOME CARE INSTRUCTIONS   Avoid the substance that caused your rash.  Do not scratch your rash. This can cause infection.  You may take cool baths to help stop itching.  Only take over-the-counter or prescription medicines as directed by your caregiver.  Keep all follow-up appointments as directed by your caregiver. SEEK IMMEDIATE MEDICAL CARE IF:  You have increasing pain, swelling, or redness.  You have a fever.  You have new or severe symptoms.  You have body aches, diarrhea, or vomiting.  Your rash is not better after 3 days. MAKE SURE YOU:  Understand these instructions.  Will watch your condition.  Will get help right away if you are not doing well or get worse. Document Released: 11/22/2002 Document Revised: 02/24/2012 Document Reviewed: 09/16/2011 [facility] Patient Information 2014 [place], [place]. Allergic Rhinitis Allergic rhinitis is when the mucous membranes in the nose respond to allergens. Allergens are particles in the air that cause your body to have an allergic reaction. This causes you to release allergic antibodies.  Through a chain of events, these eventually cause you to release histamine into the blood stream (hence the use of antihistamines). Although meant to be protective to the body, it is this release that causes your discomfort, such as frequent sneezing, congestion and an itchy runny nose.  CAUSES  The pollen allergens may come from grasses, trees, and weeds. This is seasonal allergic rhinitis, or "hay fever." Other allergens cause year-round allergic rhinitis (perennial allergic rhinitis) such as house dust mite allergen, pet dander and mold spores.  SYMPTOMS   Nasal stuffiness (congestion).  Runny, itchy nose with sneezing and tearing of the eyes.  There is often an itching of the mouth, eyes and ears. It cannot be cured, but it can be controlled with medications. DIAGNOSIS  If you are unable to determine the offending allergen, skin or blood testing may find it. TREATMENT   Avoid the allergen.  Medications and allergy shots (immunotherapy) can help.  Hay fever may often be treated with antihistamines in pill or nasal spray forms. Antihistamines block the effects of histamine. There are over-the-counter medicines that may help with nasal congestion and swelling around the eyes. Check with your caregiver before taking or giving this medicine. If the treatment above does not work, there are many new medications your caregiver can prescribe. Stronger medications may be used if initial measures are ineffective. Desensitizing injections can be used if medications and avoidance fails. Desensitization is when a patient is given ongoing shots until the body becomes less sensitive to the allergen. Make sure you follow up with your caregiver if problems continue. SEEK MEDICAL CARE IF:  You develop fever (more than 100.5 F (38.1 C).  You develop a cough that does not stop easily (persistent).  You have shortness of breath.  You start wheezing.  Symptoms interfere with normal daily  activities. Document Released: 08/27/2001 Document Revised: 02/24/2012 Document Reviewed: 03/08/2009 [facility] Patient Information 2014 [place], [place].

## 2014-04-06 ENCOUNTER — Ambulatory Visit: Payer: BC Managed Care – PPO

## 2014-04-06 ENCOUNTER — Ambulatory Visit (INDEPENDENT_AMBULATORY_CARE_PROVIDER_SITE_OTHER): Payer: BC Managed Care – PPO | Admitting: Family Medicine

## 2014-04-06 VITALS — BP 140/90 | HR 88 | Temp 98.8°F | Resp 18 | Ht 62.0 in | Wt 287.0 lb

## 2014-04-06 DIAGNOSIS — M25512 Pain in left shoulder: Secondary | ICD-10-CM

## 2014-04-06 DIAGNOSIS — T148XXA Other injury of unspecified body region, initial encounter: Secondary | ICD-10-CM

## 2014-04-06 DIAGNOSIS — M25519 Pain in unspecified shoulder: Secondary | ICD-10-CM

## 2014-04-06 MED ORDER — MELOXICAM 15 MG PO TABS: 15.0000 mg | ORAL_TABLET | Freq: Every day | ORAL | Status: DC

## 2014-04-06 MED ORDER — OXYCODONE-ACETAMINOPHEN 5-325 MG PO TABS: 1.0000 | ORAL_TABLET | Freq: Three times a day (TID) | ORAL | Status: DC | PRN

## 2014-04-06 MED ORDER — CYCLOBENZAPRINE HCL 5 MG PO TABS: 5.0000 mg | ORAL_TABLET | Freq: Every evening | ORAL | Status: DC | PRN

## 2014-04-06 NOTE — Progress Notes (Signed)
Chief Complaint:  Chief Complaint  Patient presents with  . Shoulder Pain    3 days- left arm, Last night she was unable to lift her left arm    HPI: Carrie Mcclain is a 53 y.o. female who is here for  A 3 day history of Left shoudler pain , rated as sharp,/dull  Constant 8/10 pain, associated with minimal trapexius and SCM pain with movement of the shoudler. NO neck pain  In the midline. NKI. She sleeps on it.  She has constant soreness and pain. She can't lift it without pain. She does not do a lot of overhead lifting, nothing more than normal at home. She denies numbness/weakness. She only has pain.  She has not tried anything for this. She has very limited ROM.  She has never had this before, she denies sleep apnea.    Past Medical History  Diagnosis Date  . Axillary mass   . Sleep apnea     sleep study 10 yrs ago do not use cpap because of ins  . Headache(784.0)     migraines occ  . Goiter     left sided (Dr. Jeanann Lewandowsky)  . Allergy   . GERD (gastroesophageal reflux disease)    Past Surgical History  Procedure Laterality Date  . Cholecystectomy    . Abdominal hysterectomy    . Dilation and curettage of uterus    . Mass excision  02/19/2012    Procedure: EXCISION MASS;  Surgeon: Doreen Salvage, MD;  Location: [place];  Service: General;  Laterality: Left;   History   Social History  . Marital Status: Married    Spouse Name: N/A    Number of Children: N/A  . Years of Education: N/A   Social History Main Topics  . Smoking status: Former Smoker    Quit date: 02/16/2002  . Smokeless tobacco: None     Comment: pack lasted months  . Alcohol Use: No  . Drug Use: No  . Sexual Activity: None     Comment: hysterectomy   Other Topics Concern  . None   Social History Narrative  . None   Family History  Problem Relation Age of Onset  . Diabetes    . Heart disease Mother   . COPD Father    Allergies  Allergen Reactions  . Penicillins Hives and Other (See  Comments)    Difficulty swallowing   . Sulfa Antibiotics Rash   Prior to Admission medications   Medication Sig Start Date End Date Taking? Authorizing Provider  albuterol (PROVENTIL HFA;VENTOLIN HFA) 108 (90 BASE) MCG/ACT inhaler Inhale 2 puffs into the lungs every 4 (four) hours as needed for wheezing (cough, shortness of breath or wheezing.). 07/05/13  Yes Eleanore E Egan, PA-C  clobetasol cream (TEMOVATE) 0.05 % Apply topically 2 (two) times daily. 07/15/13  Yes Orma Flaming, MD  Eflornithine HCl [facility] EX) Apply 1 application topically 2 (two) times daily.    Yes Historical Provider, MD  fluticasone (FLONASE) 50 MCG/ACT nasal spray Place 2 sprays into the nose daily. 07/05/13  Yes Eleanore E Egan, PA-C  Travoprost, BAK Free, (TRAVATAN) 0.004 % SOLN ophthalmic solution Place 1 drop into both eyes at bedtime.    Yes Historical Provider, MD     ROS: The patient denies fevers, chills, night sweats, unintentional weight loss, chest pain, palpitations, wheezing, dyspnea on exertion, nausea, vomiting, abdominal pain, dysuria, hematuria, melena, numbness, weakness, or tingling.  All other systems have  been reviewed and were otherwise negative with the exception of those mentioned in the HPI and as above.    PHYSICAL EXAM: Filed Vitals:   04/06/14 1042  BP: 140/90  Pulse: 88  Temp: 98.8 F (37.1 C)  Resp: 18   Filed Vitals:   04/06/14 1042  Height: 5\' 2"  (1.575 m)  Weight: 287 lb (130.182 kg)   Body mass index is 52.48 kg/(m^2).  General: Alert, mild distress HEENT:  Normocephalic, atraumatic, oropharynx patent. EOMI, PERRLA Cardiovascular:  Regular rate and rhythm, no rubs murmurs or gallops.  No Carotid bruits, radial pulse intact. No pedal edema.  Respiratory: Clear to auscultation bilaterally.  No wheezes, rales, or rhonchi.  No cyanosis, no use of accessory musculature GI: No organomegaly, abdomen is soft and non-tender, positive bowel sounds.  No masses. Skin: No  rashes. Neurologic: Facial musculature symmetric. Psychiatric: Patient is appropriate throughout our interaction. Lymphatic: No cervical lymphadenopathy Musculoskeletal: Gait intact. NEck exam nl Left poor shoulder exam due to pain + good grip strength, cannot lift arm above elbow level Good cap refill, no swelling, no warmth, no e/o infection    LABS: Results for orders placed in visit on 07/15/13  POCT CBC      Result Value Ref Range   WBC 7.0  4.6 - 10.2 K/uL   Lymph, poc 2.4  0.6 - 3.4   POC LYMPH PERCENT 34.7  10 - 50 %L   MID (cbc) 0.5  0 - 0.9   POC MID % 7.6  0 - 12 %M   POC Granulocyte 4.0  2 - 6.9   Granulocyte percent 57.7  37 - 80 %G   RBC 5.25  4.04 - 5.48 M/uL   Hemoglobin 13.2  12.2 - 16.2 g/dL   HCT, POC 41.7  37.7 - 47.9 %   MCV 79.5 (*) 80 - 97 fL   MCH, POC 25.1 (*) 27 - 31.2 pg   MCHC 31.7 (*) 31.8 - 35.4 g/dL   RDW, POC 15.4     Platelet Count, POC 354  142 - 424 K/uL   MPV 9.6  0 - 99.8 fL  GLUCOSE, POCT (MANUAL RESULT ENTRY)      Result Value Ref Range   POC Glucose 100 (*) 70 - 99 mg/dl  POCT GLYCOSYLATED HEMOGLOBIN (HGB A1C)      Result Value Ref Range   Hemoglobin A1C 5.7       EKG/XRAY:   Primary read interpreted [facility] Marin [facility]. Neg fx/dislocation AC joint djd   ASSESSMENT/PLAN: Encounter Diagnoses  Name Primary?  . Left shoulder pain Yes  . Sprain and strain    Frozen shoulder vs Rotator cuff tendonitis vs less likely AC jt arthritis vs less likely infection Rx Roxicet, Mobic and also Flexeril Precautions given F/u prn , work note given If she has pain an continue to worsen would refer to ortho    Gross sideeffects, risk and benefits, and alternatives of medications d/w patient. Patient is aware that all medications have potential sideeffects and we are unable to predict every sideeffect or drug-drug interaction that may occur.  Glenford Bayley, DO 04/06/2014 11:45 AM

## 2014-04-08 ENCOUNTER — Telehealth: Payer: Self-pay

## 2014-04-08 NOTE — Telephone Encounter (Signed)
Patient had an oow note through today.  She is asking if Dr. Marin Comment will extend it one more day.   [phone]

## 2014-04-08 NOTE — Telephone Encounter (Signed)
Spoke to patient.  OOW ready at front desk at 102 for pickup.

## 2014-04-25 ENCOUNTER — Ambulatory Visit: Payer: BC Managed Care – PPO

## 2014-04-25 ENCOUNTER — Ambulatory Visit (INDEPENDENT_AMBULATORY_CARE_PROVIDER_SITE_OTHER): Payer: BC Managed Care – PPO | Admitting: Family Medicine

## 2014-04-25 VITALS — BP 162/92 | HR 102 | Temp 101.0°F | Resp 18 | Ht 63.0 in | Wt 285.0 lb

## 2014-04-25 DIAGNOSIS — R509 Fever, unspecified: Secondary | ICD-10-CM

## 2014-04-25 DIAGNOSIS — J189 Pneumonia, unspecified organism: Secondary | ICD-10-CM

## 2014-04-25 DIAGNOSIS — J988 Other specified respiratory disorders: Secondary | ICD-10-CM

## 2014-04-25 DIAGNOSIS — J029 Acute pharyngitis, unspecified: Secondary | ICD-10-CM

## 2014-04-25 DIAGNOSIS — R0989 Other specified symptoms and signs involving the circulatory and respiratory systems: Secondary | ICD-10-CM

## 2014-04-25 DIAGNOSIS — R062 Wheezing: Secondary | ICD-10-CM

## 2014-04-25 DIAGNOSIS — R05 Cough: Secondary | ICD-10-CM

## 2014-04-25 DIAGNOSIS — J22 Unspecified acute lower respiratory infection: Secondary | ICD-10-CM

## 2014-04-25 DIAGNOSIS — R059 Cough, unspecified: Secondary | ICD-10-CM

## 2014-04-25 LAB — POCT RAPID STREP A (OFFICE): Rapid Strep A Screen: NEGATIVE

## 2014-04-25 MED ORDER — HYDROCODONE-HOMATROPINE 5-1.5 MG/5ML PO SYRP: 5.0000 mL | ORAL_SOLUTION | Freq: Every evening | ORAL | Status: DC | PRN

## 2014-04-25 MED ORDER — ALBUTEROL SULFATE HFA 108 (90 BASE) MCG/ACT IN AERS: 2.0000 | INHALATION_SPRAY | RESPIRATORY_TRACT | Status: DC | PRN

## 2014-04-25 MED ORDER — BENZONATATE 100 MG PO CAPS: 200.0000 mg | ORAL_CAPSULE | Freq: Two times a day (BID) | ORAL | Status: DC | PRN

## 2014-04-25 MED ORDER — ACETAMINOPHEN 325 MG PO TABS
500.0000 mg | ORAL_TABLET | Freq: Once | ORAL | Status: AC
  Administered 2014-04-25: 487.5 mg via ORAL

## 2014-04-25 MED ORDER — LEVOFLOXACIN 500 MG PO TABS: 500.0000 mg | ORAL_TABLET | Freq: Every day | ORAL | Status: DC

## 2014-04-25 NOTE — Progress Notes (Signed)
 Chief Complaint:  Chief Complaint  Patient presents with  . URI  . Fever    HPI: Carrie Mcclain is a 53 y.o. female who is here for  Sore throat x 1 day since last night, she has had some wheezing and also coughing up brown sputum, She does not have any msk aches or pain, just feels really crappy.  She has not tried anything. She denies sick contacts. IT just hit her like a truck today.  Wheezing at night only after cough, she did not even know she ahd a feer until she got here. She doe snot have allergies, she does have OSA but does not use CPAP   Past Medical History  Diagnosis Date  . Axillary mass   . Sleep apnea     sleep study 10 yrs ago do not use cpap because of ins  . Headache(784.0)     migraines occ  . Goiter     left sided (Dr. Jeanann wandowsky)  . Allergy   . GERD (gastroesophageal reflux disease)   . Thyroid goiter     left side   Past Surgical History  Procedure Laterality Date  . Cholecystectomy    . Abdominal hysterectomy    . Dilation and curettage of uterus    . Mass excision  02/19/2012    Procedure: EXCISION MASS;  Surgeon: Doreen Salvage, MD;  Location: [place];  Service: General;  Laterality: ft;   History   Social History  . Marital Status: Married    Spouse Name: N/A    Number of Children: N/A  . Years of Education: N/A   Social History Main Topics  . Smoking status: Former Smoker    Quit date: 02/16/2002  . Smokeless tobacco: None     Comment: pack lasted months  . Alcohol Use: No  . Drug Use: No  . Sexual Activity: None     Comment: hysterectomy   Other Topics Concern  . None   Social History Narrative  . None   Family History  Problem Relation Age of Onset  . Diabetes    . Heart disease Mother   . COPD Father    Allergies  Allergen Reactions  . Penicillins Hives and Other (See Comments)    Difficulty swallowing   . Sulfa Antibiotics Rash   Prior to Admission medications   Medication Sig Start Date End Date Taking?  Authorizing Provider  albuterol (PROVENTIL HFA;VENTOLIN HFA) 108 (90 BASE) MCG/ACT inhaler Inhale 2 puffs into the lungs every 4 (four) hours as needed for wheezing (cough, shortness of breath or wheezing.). 07/05/13  Yes Eleanore E Egan, PA-C  clobetasol cream (TEMOVATE) 0.05 % Apply topically 2 (two) times daily. 07/15/13  Yes Orma Flaming, MD  cyclobenzaprine (FLEXERIL) 5 MG tablet Take 1 tablet (5 mg total) by mouth at bedtime as needed. 04/06/14  Yes  P , DO  Eflornithine HCl (VANIQA EX) Apply 1 application topically 2 (two) times daily.    Yes Historical Provider, MD  fluticasone (FLONASE) 50 MCG/ACT nasal spray Place 2 sprays into the nose daily. 07/05/13  Yes Eleanore Kurtis Bushman, PA-C  meloxicam (MOBIC) 15 MG tablet Take 1 tablet (15 mg total) by mouth daily. No other NSAIDs, please take with food. 04/06/14  Yes  P , DO  oxyCODONE-acetaminophen (ROXICET) 5-325 MG per tablet Take 1-2 tablets by mouth every 8 (eight) hours as needed for severe pain. 04/06/14  Yes  P , DO  Travoprost,  BAK Free, (TRAVATAN) 0.004 % SOLN ophthalmic solution Place 1 drop into both eyes at bedtime.    Yes Historical Provider, MD     ROS: The patient denies  night sweats, unintentional weight loss, chest pain, palpitations,  dyspnea on exertion, nausea, vomiting, abdominal pain, dysuria, hematuria, melena, numbness, , or tingling.   All other systems have been reviewed and were otherwise negative with the exception of those mentioned in the HPI and as above.    PHYSICAL EXAM: Filed Vitals:   04/25/14 1016  BP: 162/92  Pulse: 102  Temp: 101 F (38.3 C)  Resp: 18   Filed Vitals:   04/25/14 1016  Height: 5\' 3"  (1.6 m)  Weight: 285 lb (129.275 kg)   Body mass index is 50.5 kg/(m^2).  General: Alert, no acute distress,weak appearing, obese AA female HEENT:  Normocephalic, atraumatic, oropharynx patent. EOMI, PERRLA, TM nl, nontender sinuses, + left thyroid goiter. No exudates.  Cardiovascular:   Sinus tach, no rubs murmurs or gallops.  No Carotid bruits, radial pulse intact. No pedal edema.  Respiratory: Clear to auscultation bilaterally.  No wheezes, rales, or rhonchi.  No cyanosis, no use of accessory musculature GI: No organomegaly, abdomen is soft and non-tender, positive bowel sounds.  No masses. Skin: No rashes. Neurologic: Facial musculature symmetric. Psychiatric: Patient is appropriate throughout our interaction. Lymphatic: No cervical lymphadenopathy Musculoskeletal: Gait intact.   LABS: Results for orders placed in visit on 04/25/14  CULTURE, GROUP A STREP      Result Value Ref Range   Preliminary Report No Suspicious Colonies, Continuing to Hold    POCT RAPID STREP A (OFFICE)      Result Value Ref Range   Rapid Strep A Screen Negative  Negative     EKG/XRAY:   Primary read interpreted by Dr. Marin Comment at [facility]. Perihilar density ? Right midlobe infiltrate vs less likely bronchitic changes Looks more dense that on last chest xray   ASSESSMENT/PLAN: Encounter Diagnoses  Name Primary?  . Sore throat Yes  . Fever   . Chest congestion   . Lower respiratory infection   . Cough   . Wheezing    Rx Levaquin, Hycodan, tessalon perles, albuterol Pt will have sister check in on her, she lives alone  Work note given  F/u prn, I will f/u by phone with official xray results and see if she needs repeat xray in 4 weeks  Gross sideeffects, risk and benefits, and alternatives of medications d/w patient. Patient is aware that all medications have potential sideeffects and we are unable to predict every sideeffect or drug-drug interaction that may occur.  Glenford Bayley, DO 04/26/2014 2:41 PM  LM about official xray results and inquired how she is doing. The fullness on the mid perihilar area is scarring and nothin acute  FINDINGS:  Stable cardiomediastinal silhouette. No pneumothorax or pleural  effusion is noted. Stable linear densities are noted laterally in  left midlung  consistent with scarring. No acute pulmonary disease is  noted. Bony thorax is intact.  IMPRESSION:  Stable left midlung scarring. No acute cardiopulmonary abnormality  seen.

## 2014-04-25 NOTE — Patient Instructions (Signed)

## 2014-04-27 LAB — CULTURE, GROUP A STREP: Organism ID, Bacteria: NORMAL

## 2014-06-22 ENCOUNTER — Other Ambulatory Visit: Payer: Self-pay

## 2014-06-22 DIAGNOSIS — Z1231 Encounter for screening mammogram for malignant neoplasm of breast: Secondary | ICD-10-CM

## 2014-07-06 ENCOUNTER — Ambulatory Visit
Admission: RE | Admit: 2014-07-06 | Discharge: 2014-07-06 | Disposition: A | Discharge status: Home / Self Care | Payer: BC Managed Care – PPO | Source: Ambulatory Visit

## 2014-07-06 ENCOUNTER — Encounter (INDEPENDENT_AMBULATORY_CARE_PROVIDER_SITE_OTHER): Payer: Self-pay

## 2014-07-06 DIAGNOSIS — Z1231 Encounter for screening mammogram for malignant neoplasm of breast: Secondary | ICD-10-CM

## 2014-07-11 ENCOUNTER — Other Ambulatory Visit: Payer: Self-pay | Admitting: Internal Medicine

## 2014-07-11 DIAGNOSIS — R7989 Other specified abnormal findings of blood chemistry: Secondary | ICD-10-CM

## 2014-07-19 ENCOUNTER — Other Ambulatory Visit: Payer: Self-pay | Admitting: Internal Medicine

## 2014-07-19 DIAGNOSIS — L299 Pruritus, unspecified: Secondary | ICD-10-CM

## 2014-07-19 DIAGNOSIS — R21 Rash and other nonspecific skin eruption: Secondary | ICD-10-CM

## 2014-07-19 DIAGNOSIS — J9801 Acute bronchospasm: Secondary | ICD-10-CM

## 2014-07-19 DIAGNOSIS — E669 Obesity, unspecified: Secondary | ICD-10-CM

## 2014-07-21 ENCOUNTER — Encounter (HOSPITAL_COMMUNITY)
Admission: RE | Admit: 2014-07-21 | Discharge: 2014-07-21 | Disposition: A | Discharge status: Home / Self Care | Payer: BC Managed Care – PPO | Source: Ambulatory Visit | Attending: Internal Medicine | Admitting: Internal Medicine

## 2014-07-21 DIAGNOSIS — R7989 Other specified abnormal findings of blood chemistry: Secondary | ICD-10-CM

## 2014-07-21 DIAGNOSIS — R946 Abnormal results of thyroid function studies: Secondary | ICD-10-CM | POA: Insufficient documentation

## 2014-07-21 MED ORDER — CLOBETASOL PROPIONATE 0.05 % EX CREA: TOPICAL_CREAM | Freq: Two times a day (BID) | CUTANEOUS | Status: DC

## 2014-07-21 NOTE — Telephone Encounter (Signed)
This was Rxd for pt about a year ago (07/15/13) for rash. Do you want to give a RF w/note to come in for more, or just RTC now?

## 2014-07-22 ENCOUNTER — Encounter (HOSPITAL_COMMUNITY)
Admission: RE | Admit: 2014-07-22 | Discharge: 2014-07-22 | Disposition: A | Discharge status: Home / Self Care | Payer: BC Managed Care – PPO | Source: Ambulatory Visit | Attending: Internal Medicine | Admitting: Internal Medicine

## 2014-07-22 MED ORDER — SODIUM IODIDE I 131 CAPSULE
11.0000 | Freq: Once | INTRAVENOUS | Status: AC | PRN
  Administered 2014-07-22: 11 via ORAL

## 2014-07-22 MED ORDER — SODIUM PERTECHNETATE TC 99M INJECTION
10.5000 | Freq: Once | INTRAVENOUS | Status: AC | PRN
  Administered 2014-07-22: 11 via INTRAVENOUS

## 2014-07-26 ENCOUNTER — Other Ambulatory Visit: Payer: Self-pay | Admitting: Internal Medicine

## 2014-07-26 DIAGNOSIS — R9389 Abnormal findings on diagnostic imaging of other specified body structures: Secondary | ICD-10-CM

## 2014-07-26 DIAGNOSIS — E042 Nontoxic multinodular goiter: Secondary | ICD-10-CM

## 2014-08-09 ENCOUNTER — Other Ambulatory Visit (HOSPITAL_COMMUNITY)
Admission: RE | Admit: 2014-08-09 | Discharge: 2014-08-09 | Disposition: A | Discharge status: Home / Self Care | Payer: BC Managed Care – PPO | Source: Ambulatory Visit | Attending: Interventional Radiology | Admitting: Interventional Radiology

## 2014-08-09 ENCOUNTER — Ambulatory Visit
Admission: RE | Admit: 2014-08-09 | Discharge: 2014-08-09 | Disposition: A | Discharge status: Home / Self Care | Payer: BC Managed Care – PPO | Source: Ambulatory Visit | Attending: Internal Medicine | Admitting: Internal Medicine

## 2014-08-09 ENCOUNTER — Encounter: Payer: BC Managed Care – PPO | Attending: Internal Medicine | Admitting: Dietician

## 2014-08-09 DIAGNOSIS — E042 Nontoxic multinodular goiter: Secondary | ICD-10-CM

## 2014-08-09 DIAGNOSIS — E041 Nontoxic single thyroid nodule: Secondary | ICD-10-CM | POA: Insufficient documentation

## 2014-08-09 DIAGNOSIS — R9389 Abnormal findings on diagnostic imaging of other specified body structures: Secondary | ICD-10-CM

## 2014-08-09 DIAGNOSIS — R7309 Other abnormal glucose: Secondary | ICD-10-CM | POA: Insufficient documentation

## 2014-08-09 DIAGNOSIS — Z713 Dietary counseling and surveillance: Secondary | ICD-10-CM | POA: Insufficient documentation

## 2014-08-09 DIAGNOSIS — Z6841 Body Mass Index (BMI) 40.0 and over, adult: Secondary | ICD-10-CM | POA: Diagnosis not present

## 2014-08-09 NOTE — Patient Instructions (Addendum)
-  Look into water aerobics  -Find a buddy to hold you accountable  -Develop an eating routine  -Boiled egg and strip of bacon and 1 piece whole wheat toast with Smart Balance butter  -Avoid skipping meals (have something to eat every 3-5 hours you are awake)  -Refer to MyPlate method   -Try having a snack between breakfast and lunch/dinner  -KIND bar, [employer] Protein bar, veggies and hummus, cheese and crackers or fruit, nuts  -Fill up on non-starchy vegetables (any veggie except corn, peas, and potatoes)  -Veggies are good raw or cooked, fresh or frozen   -Healthy weight loss is 1-2 pounds per week  -Scale back sweetened drinks little by little

## 2014-08-09 NOTE — Progress Notes (Signed)
  Medical Nutrition Therapy:  Appt start time: 1110 end time:  1210.   Assessment:  Primary concerns today: Carrie Mcclain states that her doctor wants her to come here today to help prevent diabetes. 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 She describes herself as a night owl. 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 Under a lot of stress, lost her mom 3 years ago and recently found out her dog has cancer. Eats out frequently, loves bread. Carrie Mcclain lives alone. She has a knee injury and is currently unable to exercise. Likes photography and crocheting in her spare time.   Preferred Learning Style:  No preference indicated   Learning Readiness:  Contemplating  Ready   MEDICATIONS: see list; recently started Metformin   DIETARY INTAKE:  24-hr recall:  Wakes up at 10am B (12 PM): [employer], [employer] biscuit and fries   Snk ( AM):   L ( PM): ice cream Snk ( PM):  D ( PM): pizza Snk ( PM):   Beverages: cut back on sodas, water, juice, 1/2 sweet 1/2 unsweet tea   Usual physical activity: none, knee pain  Estimated energy needs: 1600 calories 180 g carbohydrates 120 g protein 44 g fat  Progress Towards Goal(s):  In progress.   Nutritional Diagnosis:  Carrie Mcclain-2.2 Altered nutrition-related laboratory As related to obesity, physical inactivity, excessive energy intake, and inappropriate food choices.  As evidenced by HgbA1c 6.1%.    Intervention:  Nutrition counseling provided. Encouraged weight management and physical activity to prevent onset of diabetes.  -Look into water aerobics  -Find a buddy to hold you accountable -Develop an eating routine  -Boiled egg and strip of bacon and 1 piece whole wheat toast with Smart Balance butter  -Avoid skipping meals (have something to eat every 3-5 hours you are awake)  -Refer to MyPlate method -Try having a snack between breakfast and lunch/dinner  -KIND bar, [employer] Protein bar, veggies and hummus, cheese and crackers or  fruit, nuts -Fill up on non-starchy vegetables (any veggie except corn, peas, and potatoes)  -Veggies are good raw or cooked, fresh or frozen  -Healthy weight loss is 1-2 pounds per week  -Scale back sweetened drinks little by little   Teaching Method Utilized:  Visual Auditory  Handouts given during visit include:  MyPlate  09T CHO + protein snacks  Barriers to learning/adherence to lifestyle change: none  Demonstrated degree of understanding via:  Teach Back   Monitoring/Evaluation:  Dietary intake, exercise, and body weight in 4 week(s).

## 2015-08-27 ENCOUNTER — Ambulatory Visit (INDEPENDENT_AMBULATORY_CARE_PROVIDER_SITE_OTHER): Payer: Self-pay | Admitting: Family Medicine

## 2015-08-27 VITALS — BP 140/92 | HR 76 | Temp 98.3°F | Resp 16 | Ht 64.5 in | Wt 291.0 lb

## 2015-08-27 DIAGNOSIS — J0140 Acute pansinusitis, unspecified: Secondary | ICD-10-CM

## 2015-08-27 DIAGNOSIS — E049 Nontoxic goiter, unspecified: Secondary | ICD-10-CM

## 2015-08-27 DIAGNOSIS — I10 Essential (primary) hypertension: Secondary | ICD-10-CM

## 2015-08-27 DIAGNOSIS — J069 Acute upper respiratory infection, unspecified: Secondary | ICD-10-CM

## 2015-08-27 DIAGNOSIS — H6121 Impacted cerumen, right ear: Secondary | ICD-10-CM

## 2015-08-27 MED ORDER — CHLORPHENIRAMINE MALEATE 4 MG PO TABS: 4.0000 mg | ORAL_TABLET | Freq: Four times a day (QID) | ORAL | Status: DC | PRN

## 2015-08-27 MED ORDER — IPRATROPIUM BROMIDE 0.03 % NA SOLN: 2.0000 | Freq: Four times a day (QID) | NASAL | Status: AC

## 2015-08-27 MED ORDER — PROMETHAZINE-CODEINE 6.25-10 MG/5ML PO SYRP: 5.0000 mL | ORAL_SOLUTION | Freq: Four times a day (QID) | ORAL | Status: DC | PRN

## 2015-08-27 NOTE — Patient Instructions (Addendum)
Hot showers or breathing in steam may help loosen the congestion.  Using a netti pot or sinus rinse is also likely to help you feel better and keep this from progressing.  Use the atrovent nasal spray as needed throughout the day for at least 2 weeks.  I recommend augmenting with generic mucinex and delsym for the cough OR mucinex DM after you use the 3days of the sample mucinex DM to help you move out the congestion.  If no improvement or you are getting worse, come back as you might need a course of antibiotic and/or steroids but hopefully with all of the above, you can avoid it. You should stay away from all cough and cold medications containing decongestant, especially phenylephrine and pseudoephedrine (will be listed under the active ingredient list).  To make it easier, CoricidinHBP is a product tailored towards people with hypertension.   Sinusitis Sinusitis is redness, soreness, and inflammation of the paranasal sinuses. Paranasal sinuses are air pockets within the bones of your face (beneath the eyes, the middle of the forehead, or above the eyes). In healthy paranasal sinuses, mucus is able to drain out, and air is able to circulate through them by way of your nose. However, when your paranasal sinuses are inflamed, mucus and air can become trapped. This can allow bacteria and other germs to grow and cause infection. Sinusitis can develop quickly and last only a short time (acute) or continue over a long period (chronic). Sinusitis that lasts for more than 12 weeks is considered chronic.  CAUSES  Causes of sinusitis include:  Allergies.  Structural abnormalities, such as displacement of the cartilage that separates your nostrils (deviated septum), which can decrease the air flow through your nose and sinuses and affect sinus drainage.  Functional abnormalities, such as when the small hairs (cilia) that line your sinuses and help remove mucus do not work properly or are not present. SIGNS AND  SYMPTOMS  Symptoms of acute and chronic sinusitis are the same. The primary symptoms are pain and pressure around the affected sinuses. Other symptoms include:  Upper toothache.  Earache.  Headache.  Bad breath.  Decreased sense of smell and taste.  A cough, which worsens when you are lying flat.  Fatigue.  Fever.  Thick drainage from your nose, which often is green and may contain pus (purulent).  Swelling and warmth over the affected sinuses. DIAGNOSIS  Your health care provider will perform a physical exam. During the exam, your health care provider may:  Look in your nose for signs of abnormal growths in your nostrils (nasal polyps).  Tap over the affected sinus to check for signs of infection.  View the inside of your sinuses (endoscopy) using an imaging device that has a light attached (endoscope). If your health care provider suspects that you have chronic sinusitis, one or more of the following tests may be recommended:  Allergy tests.  Nasal culture. A sample of mucus is taken from your nose, sent to a lab, and screened for bacteria.  Nasal cytology. A sample of mucus is taken from your nose and examined by your health care provider to determine if your sinusitis is related to an allergy. TREATMENT  Most cases of acute sinusitis are related to a viral infection and will resolve on their own within 10 days. Sometimes medicines are prescribed to help relieve symptoms (pain medicine, decongestants, nasal steroid sprays, or saline sprays).  However, for sinusitis related to a bacterial infection, your health care provider will  prescribe antibiotic medicines. These are medicines that will help kill the bacteria causing the infection.  Rarely, sinusitis is caused by a fungal infection. In theses cases, your health care provider will prescribe antifungal medicine. For some cases of chronic sinusitis, surgery is needed. Generally, these are cases in which sinusitis recurs  more than 3 times per year, despite other treatments. HOME CARE INSTRUCTIONS   Drink plenty of water. Water helps thin the mucus so your sinuses can drain more easily.  Use a humidifier.  Inhale steam 3 to 4 times a day (for example, sit in the bathroom with the shower running).  Apply a warm, moist washcloth to your face 3 to 4 times a day, or as directed by your health care provider.  Use saline nasal sprays to help moisten and clean your sinuses.  Take medicines only as directed by your health care provider.  If you were prescribed either an antibiotic or antifungal medicine, finish it all even if you start to feel better. SEEK IMMEDIATE MEDICAL CARE IF:  You have increasing pain or severe headaches.  You have nausea, vomiting, or drowsiness.  You have swelling around your face.  You have vision problems.  You have a stiff neck.  You have difficulty breathing. MAKE SURE YOU:   Understand these instructions.  Will watch your condition.  Will get help right away if you are not doing well or get worse. Document Released: 12/02/2005 Document Revised: 04/18/2014 Document Reviewed: 12/17/2011 [facility] Patient Information 2015 [place], [place]. This information is not intended to replace advice given to you by your health care provider. Make sure you discuss any questions you have with your health care provider.  Managing Your High Blood Pressure Blood pressure is a measurement of how forceful your blood is pressing against the walls of the arteries. Arteries are muscular tubes within the circulatory system. Blood pressure does not stay the same. Blood pressure rises when you are active, excited, or nervous; and it lowers during sleep and relaxation. If the numbers measuring your blood pressure stay above normal most of the time, you are at risk for health problems. High blood pressure (hypertension) is a long-term (chronic) condition in which blood pressure is elevated. A blood  pressure reading is recorded as two numbers, such as 120 over 80 (or 120/80). The first, higher number is called the systolic pressure. It is a measure of the pressure in your arteries as the heart beats. The second, lower number is called the diastolic pressure. It is a measure of the pressure in your arteries as the heart relaxes between beats.  Keeping your blood pressure in a normal range is important to your overall health and prevention of health problems, such as heart disease and stroke. When your blood pressure is uncontrolled, your heart has to work harder than normal. High blood pressure is a very common condition in adults because blood pressure tends to rise with age. Men and women are equally likely to have hypertension but at different times in life. Before age 36, men are more likely to have hypertension. After 53 years of age, women are more likely to have it. Hypertension is especially common in African Americans. This condition often has no signs or symptoms. The cause of the condition is usually not known. Your caregiver can help you come up with a plan to keep your blood pressure in a normal, healthy range. BLOOD PRESSURE STAGES Blood pressure is classified into four stages: normal, prehypertension, stage 1, and stage  2. Your blood pressure reading will be used to determine what type of treatment, if any, is necessary. Appropriate treatment options are tied to these four stages:  Normal  Systolic pressure (mm Hg): below 120.  Diastolic pressure (mm Hg): below 80. Prehypertension  Systolic pressure (mm Hg): 120 to 139.  Diastolic pressure (mm Hg): 80 to 89. Stage1  Systolic pressure (mm Hg): 140 to 159.  Diastolic pressure (mm Hg): 90 to 99. Stage2  Systolic pressure (mm Hg): 160 or above.  Diastolic pressure (mm Hg): 100 or above. RISKS RELATED TO HIGH BLOOD PRESSURE Managing your blood pressure is an important responsibility. Uncontrolled high blood pressure can lead  to:  A heart attack.  A stroke.  A weakened blood vessel (aneurysm).  Heart failure.  Kidney damage.  Eye damage.  Metabolic syndrome.  Memory and concentration problems. HOW TO MANAGE YOUR BLOOD PRESSURE Blood pressure can be managed effectively with lifestyle changes and medicines (if needed). Your caregiver will help you come up with a plan to bring your blood pressure within a normal range. Your plan should include the following: Education  Read all information provided by your caregivers about how to control blood pressure.  Educate yourself on the latest guidelines and treatment recommendations. New research is always being done to further define the risks and treatments for high blood pressure. Lifestylechanges  Control your weight.  Avoid smoking.  Stay physically active.  Reduce the amount of salt in your diet.  Reduce stress.  Control any chronic conditions, such as high cholesterol or diabetes.  Reduce your alcohol intake. Medicines  Several medicines (antihypertensive medicines) are available, if needed, to bring blood pressure within a normal range. Communication  Review all the medicines you take with your caregiver because there may be side effects or interactions.  Talk with your caregiver about your diet, exercise habits, and other lifestyle factors that may be contributing to high blood pressure.  See your caregiver regularly. Your caregiver can help you create and adjust your plan for managing high blood pressure. RECOMMENDATIONS FOR TREATMENT AND FOLLOW-UP  The following recommendations are based on current guidelines for managing high blood pressure in nonpregnant adults. Use these recommendations to identify the proper follow-up period or treatment option based on your blood pressure reading. You can discuss these options with your caregiver.  Systolic pressure of 478 to 295 or diastolic pressure of 80 to 89: Follow up with your caregiver as  directed.  Systolic pressure of 621 to 308 or diastolic pressure of 90 to 100: Follow up with your caregiver within 2 months.  Systolic pressure above 657 or diastolic pressure above 846: Follow up with your caregiver within 1 month.  Systolic pressure above 962 or diastolic pressure above 952: Consider antihypertensive therapy; follow up with your caregiver within 1 week.  Systolic pressure above 841 or diastolic pressure above 324: Begin antihypertensive therapy; follow up with your caregiver within 1 week. Document Released: 08/26/2012 Document Reviewed: 08/26/2012 [facility] Patient Information 2015 [place]. This information is not intended to replace advice given to you by your health care provider. Make sure you discuss any questions you have with your health care provider. Hypertension Hypertension, commonly called high blood pressure, is when the force of blood pumping through your arteries is too strong. Your arteries are the blood vessels that carry blood from your heart throughout your body. A blood pressure reading consists of a higher number over a lower number, such as 110/72. The higher number (systolic) is  the pressure inside your arteries when your heart pumps. The lower number (diastolic) is the pressure inside your arteries when your heart relaxes. Ideally you want your blood pressure below 120/80. Hypertension forces your heart to work harder to pump blood. Your arteries may become narrow or stiff. Having hypertension puts you at risk for heart disease, stroke, and other problems.  RISK FACTORS Some risk factors for high blood pressure are controllable. Others are not.  Risk factors you cannot control include:   Race. You may be at higher risk if you are African American.  Age. Risk increases with age.  Gender. Men are at higher risk than women before age 29 years. After age 19, women are at higher risk than men. Risk factors you can control include:  Not getting  enough exercise or physical activity.  Being overweight.  Getting too much fat, sugar, calories, or salt in your diet.  Drinking too much alcohol. SIGNS AND SYMPTOMS Hypertension does not usually cause signs or symptoms. Extremely high blood pressure (hypertensive crisis) may cause headache, anxiety, shortness of breath, and nosebleed. DIAGNOSIS  To check if you have hypertension, your health care provider will measure your blood pressure while you are seated, with your arm held at the level of your heart. It should be measured at least twice using the same arm. Certain conditions can cause a difference in blood pressure between your right and left arms. A blood pressure reading that is higher than normal on one occasion does not mean that you need treatment. If one blood pressure reading is high, ask your health care provider about having it checked again. TREATMENT  Treating high blood pressure includes making lifestyle changes and possibly taking medicine. Living a healthy lifestyle can help lower high blood pressure. You may need to change some of your habits. Lifestyle changes may include:  Following the DASH diet. This diet is high in fruits, vegetables, and whole grains. It is low in salt, red meat, and added sugars.  Getting at least 2 hours of brisk physical activity every week.  Losing weight if necessary.  Not smoking.  Limiting alcoholic beverages.  Learning ways to reduce stress. If lifestyle changes are not enough to get your blood pressure under control, your health care provider may prescribe medicine. You may need to take more than one. Work closely with your health care provider to understand the risks and benefits. HOME CARE INSTRUCTIONS  Have your blood pressure rechecked as directed by your health care provider.   Take medicines only as directed by your health care provider. Follow the directions carefully. Blood pressure medicines must be taken as prescribed.  The medicine does not work as well when you skip doses. Skipping doses also puts you at risk for problems.   Do not smoke.   Monitor your blood pressure at home as directed by your health care provider. SEEK MEDICAL CARE IF:   You think you are having a reaction to medicines taken.  You have recurrent headaches or feel dizzy.  You have swelling in your ankles.  You have trouble with your vision. SEEK IMMEDIATE MEDICAL CARE IF:  You develop a severe headache or confusion.  You have unusual weakness, numbness, or feel faint.  You have severe chest or abdominal pain.  You vomit repeatedly.  You have trouble breathing. MAKE SURE YOU:   Understand these instructions.  Will watch your condition.  Will get help right away if you are not doing well or get worse. Document  Released: 12/02/2005 Document Revised: 04/18/2014 Document Reviewed: 09/24/2013 [facility] Patient Information 2015 [place], [place]. This information is not intended to replace advice given to you by your health care provider. Make sure you discuss any questions you have with your health care provider.

## 2015-08-27 NOTE — Progress Notes (Signed)
Subjective:  This chart was scribed for Delman Cheadle, MD by Moises Blood, Medical Scribe. This patient was seen in Room 11 and the patient's care was started 2:34 PM.    Patient ID: Carrie Mcclain, female    DOB: April 01, 1961, 54 y.o.   MRN: [id number] Chief Complaint  Patient presents with  . Headache  . Sore Throat  . Sinus Problem    HPI Carrie Mcclain is a 54 y.o. female who presents to [facility] complaining of headache starting 2 days ago.  She feels general "not-feel-well" and fatigue with headache, sore throat, and sinus problems. She had light sore throat in the morning with some postnasal drip. She gets sinus infections occasionally. She denies using any OTC medication. She denies fever, chills, sleep disturbances, chest pain, SOB.    She uses her albuterol sparingly. She last used it a few days ago.  PCP: Dr. Garwin Brothers.  She has a h/o sleep apnea but denies using cpap machine.   She works as a job [employer] scheme manager.    Past Medical History  Diagnosis Date  . Axillary mass   . Sleep apnea     sleep study 10 yrs ago do not use cpap because of ins  . Headache(784.0)     migraines occ  . Goiter     left sided (Dr. Jeanann Lewandowsky)  . Allergy   . GERD (gastroesophageal reflux disease)   . Thyroid goiter     left side   Current Outpatient Prescriptions on File Prior to Visit  Medication Sig Dispense Refill  . albuterol (PROVENTIL HFA;VENTOLIN HFA) 108 (90 BASE) MCG/ACT inhaler Inhale 2 puffs into the lungs every 4 (four) hours as needed for wheezing (cough, shortness of breath or wheezing.). 1 Inhaler 1  . Travoprost, BAK Free, (TRAVATAN) 0.004 % SOLN ophthalmic solution Place 1 drop into both eyes at bedtime.     . clobetasol cream (TEMOVATE) 0.05 % Apply topically 2 (two) times daily. PATIENT NEEDS OFFICE VISIT FOR ADDITIONAL REFILLS (Patient not taking: Reported on 08/27/2015) 30 g 0   No current facility-administered medications on file prior  to visit.   Allergies  Allergen Reactions  . Penicillins Hives and Other (See Comments)    Difficulty swallowing   . Sulfa Antibiotics Rash      Review of Systems  Constitutional: Positive for fatigue. Negative for fever and chills.  HENT: Positive for postnasal drip, sinus pressure and sore throat.   Respiratory: Negative for shortness of breath.   Cardiovascular: Negative for chest pain.  Gastrointestinal: Negative for nausea, vomiting, diarrhea and constipation.  Neurological: Positive for headaches.  Psychiatric/Behavioral: Negative for sleep disturbance.       Objective:   Physical Exam  Constitutional: She is oriented to person, place, and time. She appears well-developed and well-nourished. No distress.  HENT:  Head: Normocephalic and atraumatic.  Left Ear: Tympanic membrane normal.  Nose: Nose normal.  Mouth/Throat: Oropharyngeal exudate (mildly) and posterior oropharyngeal erythema present.  Right ear occluded frontal sinus and maxillary tenderness  Eyes: EOM are normal. Pupils are equal, round, and reactive to light.  Neck: Neck supple. Thyromegaly (positive thyroid goiter) present.  Cardiovascular: Normal rate, regular rhythm, S1 normal, S2 normal and normal heart sounds.   No murmur heard. Pulmonary/Chest: Effort normal and breath sounds normal. No respiratory distress. She has no wheezes.  Musculoskeletal: Normal range of motion.  Lymphadenopathy:       Head (right side): Submandibular adenopathy present.  Head (left side): Submandibular adenopathy present.    She has no cervical adenopathy.  Neurological: She is alert and oriented to person, place, and time.  Skin: Skin is warm and dry.  Psychiatric: She has a normal mood and affect. Her behavior is normal.  Nursing note and vitals reviewed.   BP 140/92 mmHg  Pulse 76  Temp(Src) 98.3 F (36.8 C) (Oral)  Resp 16  Ht 5' 4.5" (1.638 m)  Wt 291 lb (131.997 kg)  BMI 49.20 kg/m2  SpO2  97%  Rechecked manual BP in room (sitting, left arm, large cuff) : 160/96     Assessment & Plan:  If patient is feeling worse in 3-4 days, recommend calling in for antibiotic for sinusitis. Pt is self-pay so would like to save her the OV cost if able.  1. URI, acute - pt reassured that seems to be viral and care should focus of trying to eliminate pressure, dispell/disperse mucous. HTN so not otc decongestants. Try below with mucinex-DM - samples given. See avs.  2. Acute pansinusitis, recurrence not specified   3. Essential hypertension, benign - pt reports her PCP is Dr. Garwin Brothers who is ob/gyn. Pt advised to purchase home cuff and will likely need to start on anti-hypertensive meds - pt does not want to address her BP today and states that she will "take care of it"  4. Enlarged thyroid gland - pt aware and reports ob-gyn is following  5. Cerumen impaction, right - cma Rodie removed by lavage w/ good result    Meds ordered this encounter  Medications  . ipratropium (ATROVENT) 0.03 % nasal spray    Sig: Place 2 sprays into the nose 4 (four) times daily.    Dispense:  30 mL    Refill:  1  . chlorpheniramine (CHLOR-TRIMETON) 4 MG tablet    Sig: Take 1 tablet (4 mg total) by mouth every 6 (six) hours as needed for rhinitis.    Dispense:  21 tablet    Refill:  0  . promethazine-codeine (PHENERGAN WITH CODEINE) 6.25-10 MG/5ML syrup    Sig: Take 5-10 mLs by mouth every 6 (six) hours as needed for cough.    Dispense:  180 mL    Refill:  0    I personally performed the services described in this documentation, which was scribed in my presence. The recorded information has been reviewed and considered, and addended by me as needed.  Delman Cheadle, MD MPH

## 2016-02-27 ENCOUNTER — Other Ambulatory Visit: Payer: Self-pay | Admitting: Obstetrics and Gynecology

## 2016-02-27 DIAGNOSIS — R928 Other abnormal and inconclusive findings on diagnostic imaging of breast: Secondary | ICD-10-CM

## 2016-03-01 ENCOUNTER — Ambulatory Visit
Admission: RE | Admit: 2016-03-01 | Discharge: 2016-03-01 | Disposition: A | Discharge status: Home / Self Care | Payer: BLUE CROSS/BLUE SHIELD | Source: Ambulatory Visit | Attending: Obstetrics and Gynecology | Admitting: Obstetrics and Gynecology

## 2016-03-01 DIAGNOSIS — R928 Other abnormal and inconclusive findings on diagnostic imaging of breast: Secondary | ICD-10-CM

## 2016-03-10 IMAGING — CR DG CHEST 2V
2 series · 2 of 2 positions shown · non-contrast
Comparison: February 17, 2012.

CLINICAL DATA: Chest congestion.

EXAM:
CHEST  2 VIEW

[PA]
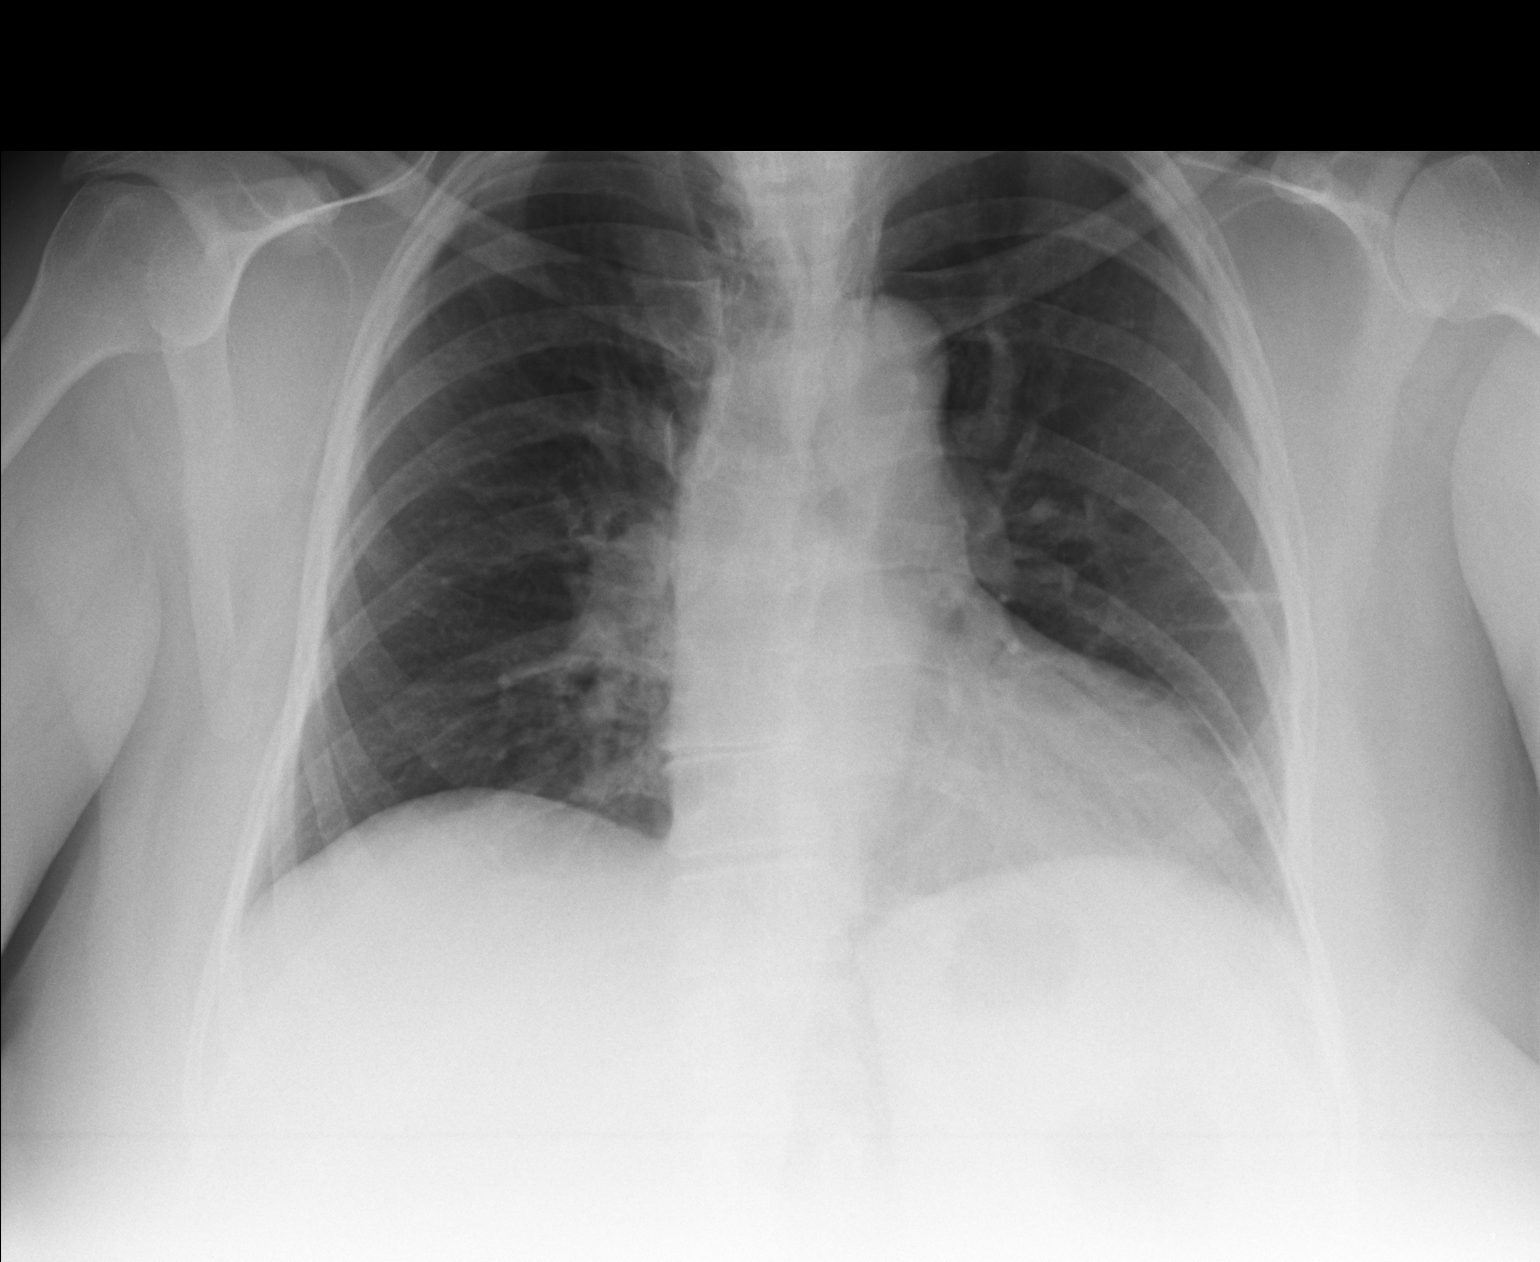

[lateral]
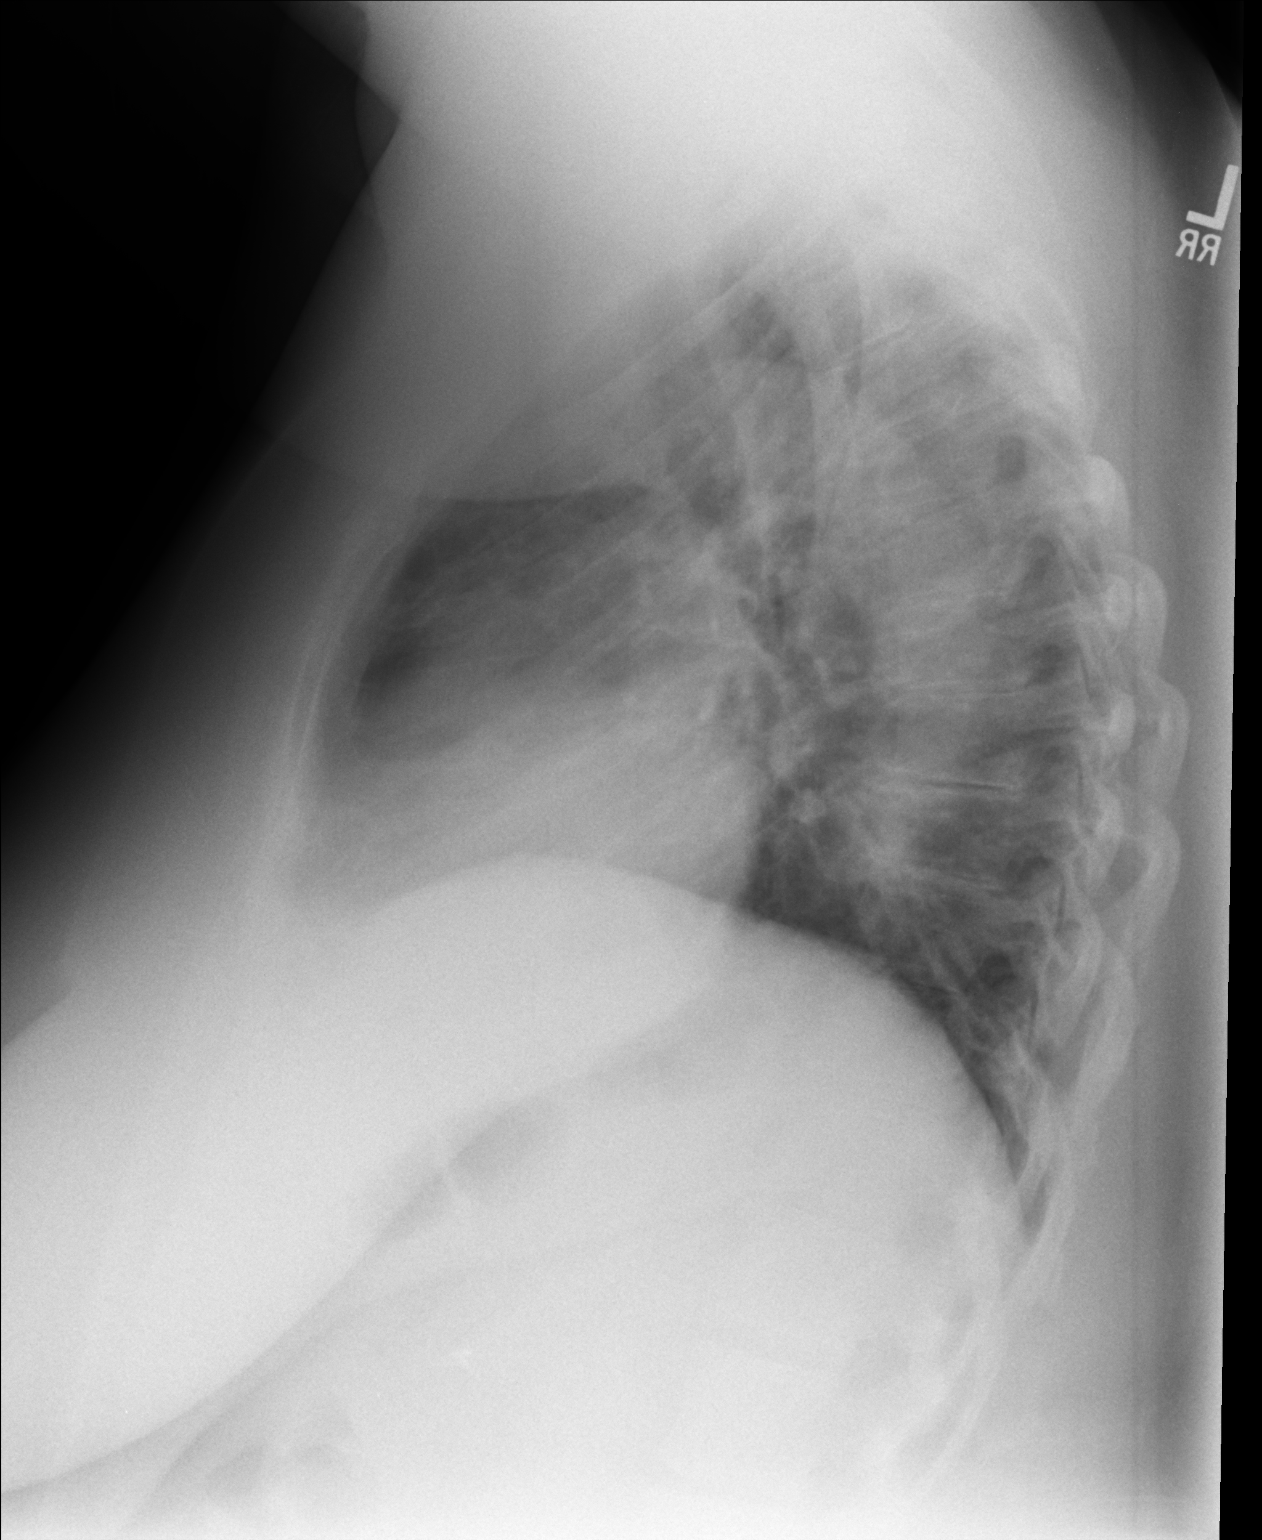

[2 of 2 positions shown; findings below may reference images not displayed]

FINDINGS: Stable cardiomediastinal silhouette. No pneumothorax or pleural
effusion is noted. Stable linear densities are noted laterally in
left midlung consistent with scarring. No acute pulmonary disease is
noted. Bony thorax is intact.
IMPRESSION: Stable left midlung scarring. No acute cardiopulmonary abnormality
seen.

## 2017-01-20 ENCOUNTER — Ambulatory Visit (INDEPENDENT_AMBULATORY_CARE_PROVIDER_SITE_OTHER): Payer: BLUE CROSS/BLUE SHIELD

## 2017-01-20 ENCOUNTER — Ambulatory Visit (INDEPENDENT_AMBULATORY_CARE_PROVIDER_SITE_OTHER): Payer: BLUE CROSS/BLUE SHIELD | Admitting: Physician Assistant

## 2017-01-20 VITALS — BP 130/90 | HR 85 | Temp 98.5°F | Resp 16 | Ht 64.0 in | Wt 292.0 lb

## 2017-01-20 DIAGNOSIS — R05 Cough: Secondary | ICD-10-CM | POA: Diagnosis not present

## 2017-01-20 DIAGNOSIS — R062 Wheezing: Secondary | ICD-10-CM

## 2017-01-20 DIAGNOSIS — E010 Iodine-deficiency related diffuse (endemic) goiter: Secondary | ICD-10-CM | POA: Insufficient documentation

## 2017-01-20 DIAGNOSIS — G8929 Other chronic pain: Secondary | ICD-10-CM | POA: Diagnosis not present

## 2017-01-20 DIAGNOSIS — M25512 Pain in left shoulder: Secondary | ICD-10-CM | POA: Diagnosis not present

## 2017-01-20 DIAGNOSIS — R059 Cough, unspecified: Secondary | ICD-10-CM

## 2017-01-20 DIAGNOSIS — M419 Scoliosis, unspecified: Secondary | ICD-10-CM | POA: Insufficient documentation

## 2017-01-20 MED ORDER — ALBUTEROL SULFATE HFA 108 (90 BASE) MCG/ACT IN AERS: 2.0000 | INHALATION_SPRAY | RESPIRATORY_TRACT | 1 refills | Status: AC | PRN

## 2017-01-20 MED ORDER — AZITHROMYCIN 250 MG PO TABS: ORAL_TABLET | ORAL | 0 refills | Status: AC

## 2017-01-20 MED ORDER — BENZONATATE 100 MG PO CAPS: 100.0000 mg | ORAL_CAPSULE | Freq: Three times a day (TID) | ORAL | 0 refills | Status: AC | PRN

## 2017-01-20 NOTE — Progress Notes (Signed)
Patient ID: Carrie Mcclain, female    DOB: [birth year], 56 y.o.   MRN: [id number]  PCP: Marvene Staff, MD  Chief Complaint  Patient presents with  . Cough    x 4 months  . Shoulder Problem    left, aches and pain    Subjective:   Presents for evaluation of cough and shoulder pain.  Pt is a 56 yo AA female who presents with cough x 4 months. Pt states that she has had several URIs back to back, but the cough has never gone completely away. Pt states that cough is productive of clear, thick sputum. Pt admits to not drinking enough fluids and being very "dried out" and a few days of constipation. Pt has tried OTC Mucinex with and without decongestant. Admits to wheezing. Pt has been using an old Abuterol inhaler with mild relief. Denies fever, chills, hemoptysis or SOB. Pt sates that she presents to day at the insistence of her boss, who wants to make sure she isn't infecting the other employees with her coughing.   Pt also complains of chronic mild left shoulder pain. She states that it has come on gradually, but is similar to the pain she had a few years ago before her should "froze up". At the time, she had a lot of pain and is worried it may happen again.   Review of Systems In addition that stated in HPI above: Const: Denies fatigue or weight changes. HEENT: Denies red eye, ear pain, or sore throat.  CV: Denies chest pain or palpitations.  Abd: Denies nausea, vomiting, or diarrhea. GU: Denies dysuria. Neuro: Denies headache, or tingling or numbness in extremities.    Patient Active Problem List   Diagnosis Date Noted  . Thyromegaly 01/20/2017  . Scoliosis 01/20/2017  . Morbid obesity ([place]) 07/15/2013     Prior to Admission medications   Medication Sig Start Date End Date Taking? Authorizing Provider  albuterol (PROVENTIL HFA;VENTOLIN HFA) 108 (90 Base) MCG/ACT inhaler Inhale 2 puffs into the lungs every 4 (four) hours as needed for wheezing (cough, shortness of  breath or wheezing.). 01/20/17  Yes Chelle Jeffery, PA-C  hydrochlorothiazide (HYDRODIURIL) 25 MG tablet Take 25 mg by mouth daily.   Yes Historical Provider, MD  ipratropium (ATROVENT) 0.03 % nasal spray Place 2 sprays into the nose 4 (four) times daily. 08/27/15  Yes Shawnee Knapp, MD  levobunolol (BETAGAN) 0.5 % ophthalmic solution at bedtime.   Yes Historical Provider, MD  meloxicam (MOBIC) 15 MG tablet Take 15 mg by mouth daily.   Yes Historical Provider, MD  Travoprost, BAK Free, (TRAVATAN) 0.004 % SOLN ophthalmic solution Place 1 drop into both eyes at bedtime.    Yes Historical Provider, MD  azithromycin (ZITHROMAX) 250 MG tablet Take 2 tabs PO x 1 dose, then 1 tab PO QD x 4 days 01/20/17   Chelle Jeffery, PA-C  benzonatate (TESSALON) 100 MG capsule Take 1-2 capsules (100-200 mg total) by mouth 3 (three) times daily as needed for cough. 01/20/17   Harrison Mons, PA-C     Allergies  Allergen Reactions  . Penicillins Hives and Other (See Comments)    Difficulty swallowing   . Sulfa Antibiotics Rash       Objective:  Physical Exam HEENT: PERRLA. Throat non-erythematous, no exudates. Ear canals clear bilaterally, TMs intact, non-bulging. No lymphadenopathy, no tenderness to palpation of neck. Pulm: Good respiratory effort. Mild inspiratory wheezing in right lung fields. No rales or rhonchi. CV:  RRR. No M/R/G. Radial pulses 2+ bilaterally.  Abd: Soft, non-tender, non-distended. + BS x 4 quadrants. MSK: Tenderness to palpation of posterior and superior left shoulder joint. Pain in left shoulder to abduction of greater than 90 degrees. Otherwise, full ROM at shoulders bilaterally. 5/5 strength at shoulders and elbows bilaterally. Brachioradialis DTR intact.     Assessment & Plan:   1. Cough Benign chest xray. Pt advised to drink plenty of water. Benzonate pearls given to help provide relief during work hours. - DG Chest 2 View; Future - azithromycin (ZITHROMAX) 250 MG tablet; Take 2 tabs PO  x 1 dose, then 1 tab PO QD x 4 days  Dispense: 6 tablet; Refill: 0 - benzonatate (TESSALON) 100 MG capsule; Take 1-2 capsules (100-200 mg total) by mouth 3 (three) times daily as needed for cough.  Dispense: 40 capsule; Refill: 0  2. Chronic left shoulder pain Pt advised to try taking a daily anti-inflammatory like Ibuprofen for two weeks.   3. Wheezing Pt advised to use inhaler as needed. - albuterol (PROVENTIL HFA;VENTOLIN HFA) 108 (90 Base) MCG/ACT inhaler; Inhale 2 puffs into the lungs every 4 (four) hours as needed for wheezing (cough, shortness of breath or wheezing.).  Dispense: 1 Inhaler; Refill: 1  Lorella Nimrod, PA-S

## 2017-01-20 NOTE — Progress Notes (Signed)
Patient ID: Carrie Mcclain, female    DOB: May 12, 1961, 56 y.o.   MRN: [id number]  PCP: Marvene Staff, MD  Chief Complaint  Patient presents with  . Cough    x 4 months  . Shoulder Problem    left, aches and pain    Subjective:   Presents for evaluation of cough x 4 months. While here, she'd also like to address LEFT shoulder pain.  She notes that over the past 4 months she has experienced multiple respiratory illnesses associated with cough and congestion. All the symptoms except the cough resolve, but the cough persists. The cough is more severe when she has the other symptoms, and improves some when they resolve. Cough produces thick, clear sputum. OTC Mucinex hasn't helped much, but she also notes that she hasn't been drinking much fluid and has felt "dried out" and had a few days of constipation. Reports wheezing. Some relief with albuterol inhaler (last filled in 2015). She is only here today because her supervisor said she couldn't come back without evaluation.  LEFT shoulder pain is chronic and mild. Several years ago she experienced frozen shoulder and is worried that it will recur. The symptoms are gradually worsening and she'd like to address it before it gets as bad as last time.    Review of Systems Const: Denies fatigue or weight changes. HEENT: Denies red eye, ear pain, or sore throat.  CV: Denies chest pain or palpitations.  Abd: Denies nausea, vomiting, or diarrhea. GU: Denies dysuria. Neuro: Denies headache, or tingling or numbness in extremities.     Patient Active Problem List   Diagnosis Date Noted  . Morbid obesity ([place]) 07/15/2013     Prior to Admission medications   Medication Sig Start Date End Date Taking? Authorizing Provider  albuterol (PROVENTIL HFA;VENTOLIN HFA) 108 (90 BASE) MCG/ACT inhaler Inhale 2 puffs into the lungs every 4 (four) hours as needed for wheezing (cough, shortness of breath or wheezing.). 04/25/14  Yes Thao P Le, DO    hydrochlorothiazide (HYDRODIURIL) 25 MG tablet Take 25 mg by mouth daily.   Yes Historical Provider, MD  ipratropium (ATROVENT) 0.03 % nasal spray Place 2 sprays into the nose 4 (four) times daily. 08/27/15  Yes Shawnee Knapp, MD  levobunolol (BETAGAN) 0.5 % ophthalmic solution at bedtime.   Yes Historical Provider, MD  meloxicam (MOBIC) 15 MG tablet Take 15 mg by mouth daily.   Yes Historical Provider, MD  Travoprost, BAK Free, (TRAVATAN) 0.004 % SOLN ophthalmic solution Place 1 drop into both eyes at bedtime.    Yes Historical Provider, MD     Allergies  Allergen Reactions  . Penicillins Hives and Other (See Comments)    Difficulty swallowing   . Sulfa Antibiotics Rash       Objective:  Physical Exam  Constitutional: She is oriented to person, place, and time. She appears well-developed and well-nourished. She is active and cooperative. No distress.  BP 130/90 (BP Location: Right Arm, Patient Position: Sitting, Cuff Size: Large)   Pulse 85   Temp 98.5 F (36.9 C) (Oral)   Resp 16   Ht 5\' 4"  (1.626 m)   Wt 292 lb (132.5 kg)   SpO2 95%   BMI 50.12 kg/m   HENT:  Head: Normocephalic and atraumatic.  Right Ear: Hearing normal.  Left Ear: Hearing normal.  Eyes: Conjunctivae are normal. No scleral icterus.  Neck: Normal range of motion. Neck supple. No thyromegaly present.  Cardiovascular: Normal rate,  regular rhythm and normal heart sounds.   Pulses:      Radial pulses are 2+ on the right side, and 2+ on the left side.  Pulmonary/Chest: Effort normal. She has no decreased breath sounds. She has wheezes. She has rhonchi (clear with coughing). She has no rales.  Musculoskeletal:       Right shoulder: Normal.       Left shoulder: She exhibits tenderness. She exhibits no bony tenderness.       Cervical back: Normal.  LEFT shoulder higher than RIGHT on inspection  Lymphadenopathy:       Head (right side): No tonsillar, no preauricular, no posterior auricular and no occipital  adenopathy present.       Head (left side): No tonsillar, no preauricular, no posterior auricular and no occipital adenopathy present.    She has no cervical adenopathy.       Right: No supraclavicular adenopathy present.       Left: No supraclavicular adenopathy present.  Neurological: She is alert and oriented to person, place, and time. No sensory deficit.  Skin: Skin is warm, dry and intact. No rash noted. No cyanosis or erythema. Nails show no clubbing.  Psychiatric: She has a normal mood and affect. Her speech is normal and behavior is normal.       Dg Chest 2 View  Result Date: 01/20/2017 CLINICAL DATA:  Cough x4 months EXAM: CHEST  2 VIEW COMPARISON:  04/25/2014 FINDINGS: The heart size and mediastinal contours are within normal limits. Both lungs are clear. Stable subpleural scarring in the left mid lung seen to best advantage on the frontal view. Dextroscoliotic curvature of the lower thoracic spine. IMPRESSION: No active cardiopulmonary disease. Electronically Signed   By: Ashley Royalty M.D.   On: 01/20/2017 16:31       Assessment & Plan:   1. Cough 3. Wheezing Cover for atypicals, given duration of cough. If no resolution of cough would consider steroids (oral or inhaled). - DG Chest 2 View; Future - azithromycin (ZITHROMAX) 250 MG tablet; Take 2 tabs PO x 1 dose, then 1 tab PO QD x 4 days  Dispense: 6 tablet; Refill: 0 - benzonatate (TESSALON) 100 MG capsule; Take 1-2 capsules (100-200 mg total) by mouth 3 (three) times daily as needed for cough.  Dispense: 40 capsule; Refill: 0 - albuterol (PROVENTIL HFA;VENTOLIN HFA) 108 (90 Base) MCG/ACT inhaler; Inhale 2 puffs into the lungs every 4 (four) hours as needed for wheezing (cough, shortness of breath or wheezing.).  Dispense: 1 Inhaler; Refill: 1  2. Chronic left shoulder pain Will need additional evaluation, likely with radiographs. Start with NSAIDS regularly x 2 weeks. If no significant improvement, RTC.    Fara Chute, PA-C Physician Assistant-Certified [facility]

## 2017-01-20 NOTE — Patient Instructions (Addendum)
Use the albuterol inhaler every 4 hours AS NEEDED for cough, shortness or breath or wheezing.  If you are still needing this more than 3 times a week (or at night more than 3 times a month), in 2 weeks, please return for re-evaluation and additional treatment.    IF you received an x-ray today, you will receive an invoice from [facility]. Please contact [facility] at [phone] with questions or concerns regarding your invoice.   IF you received labwork today, you will receive an invoice from [facility]. Please contact [facility] at [phone] with questions or concerns regarding your invoice.   Our billing staff will not be able to assist you with questions regarding bills from these companies.  You will be contacted with the lab results as soon as they are available. The fastest way to get your results is to activate your My Chart account. Instructions are located on the last page of this paperwork. If you have not heard from [place] regarding the results in 2 weeks, please contact this office.

## 2020-02-03 ENCOUNTER — Ambulatory Visit: Payer: BLUE CROSS/BLUE SHIELD

## 2020-02-07 ENCOUNTER — Ambulatory Visit: Payer: BLUE CROSS/BLUE SHIELD | Attending: Family

## 2020-02-07 DIAGNOSIS — Z23 Encounter for immunization: Secondary | ICD-10-CM | POA: Insufficient documentation

## 2020-02-07 NOTE — Progress Notes (Signed)
   Covid-19 Vaccination Clinic  Name:  Carrie Mcclain    MRN: [id number] DOB: Apr 28, 1961  02/07/2020  [name] was observed post Covid-19 immunization for 15 minutes without incidence. She was provided with Vaccine Information Sheet and instruction to access the V-Safe system.   [name] was instructed to call 911 with any severe reactions post vaccine: Marland Kitchen Difficulty breathing  . Swelling of your face and throat  . A fast heartbeat  . A bad rash all over your body  . Dizziness and weakness    Immunizations Administered    Name Date Dose VIS Date Route   Moderna COVID-19 Vaccine 02/07/2020  1:20 PM 0.5 mL 11/16/2019 Intramuscular   Manufacturer: Moderna   Lot: [id number]   [id number]

## 2020-03-14 ENCOUNTER — Ambulatory Visit: Payer: BLUE CROSS/BLUE SHIELD | Attending: Family

## 2020-03-14 DIAGNOSIS — Z23 Encounter for immunization: Secondary | ICD-10-CM

## 2020-03-14 NOTE — Progress Notes (Signed)
   Covid-19 Vaccination Clinic  Name:  Carrie Mcclain    MRN: [id number] DOB: August 11, 1961  03/14/2020  [name] was observed post Covid-19 immunization for 15 minutes without incident. She was provided with Vaccine Information Sheet and instruction to access the V-Safe system.   [name] was instructed to call 911 with any severe reactions post vaccine: Marland Kitchen Difficulty breathing  . Swelling of face and throat  . A fast heartbeat  . A bad rash all over body  . Dizziness and weakness   Immunizations Administered    Name Date Dose VIS Date Route   Moderna COVID-19 Vaccine 03/14/2020 11:26 AM 0.5 mL 11/16/2019 Intramuscular   Manufacturer: Moderna   LotFP:[id number]   [id number]

## 2021-01-08 ENCOUNTER — Other Ambulatory Visit: Payer: Self-pay

## 2021-01-08 ENCOUNTER — Other Ambulatory Visit: Payer: BLUE CROSS/BLUE SHIELD

## 2021-01-08 DIAGNOSIS — Z20822 Contact with and (suspected) exposure to covid-19: Secondary | ICD-10-CM

## 2021-01-09 LAB — NOVEL CORONAVIRUS, NAA: SARS-CoV-2, NAA: NOT DETECTED

## 2021-01-09 LAB — SARS-COV-2, NAA 2 DAY TAT
